# Patient Record
Sex: Male | Born: 1935 | Race: White | Hispanic: No | Smoking: Never smoker
Health system: Southern US, Community
[De-identification: ages and names within clinical notes are randomized; demographics above are authoritative.]

## PROBLEM LIST (undated history)

## (undated) DIAGNOSIS — C61 Malignant neoplasm of prostate: Secondary | ICD-10-CM

## (undated) DIAGNOSIS — F419 Anxiety disorder, unspecified: Secondary | ICD-10-CM

## (undated) DIAGNOSIS — Z45018 Encounter for adjustment and management of other part of cardiac pacemaker: Secondary | ICD-10-CM

## (undated) DIAGNOSIS — I200 Unstable angina: Secondary | ICD-10-CM

## (undated) DIAGNOSIS — R739 Hyperglycemia, unspecified: Secondary | ICD-10-CM

## (undated) DIAGNOSIS — E7800 Pure hypercholesterolemia, unspecified: Secondary | ICD-10-CM

## (undated) DIAGNOSIS — I442 Atrioventricular block, complete: Principal | ICD-10-CM

## (undated) DIAGNOSIS — N183 Chronic kidney disease, stage 3 (moderate): Secondary | ICD-10-CM

## (undated) DIAGNOSIS — I10 Essential (primary) hypertension: Secondary | ICD-10-CM

## (undated) DIAGNOSIS — I2510 Atherosclerotic heart disease of native coronary artery without angina pectoris: Secondary | ICD-10-CM

## (undated) HISTORY — DX: Atherosclerotic heart disease of native coronary artery without angina pectoris: I25.10

## (undated) HISTORY — DX: Encounter for adjustment and management of other part of cardiac pacemaker: Z45.018

## (undated) HISTORY — DX: Hyperglycemia, unspecified: R73.9

## (undated) HISTORY — DX: Unstable angina: I20.0

## (undated) HISTORY — DX: Pure hypercholesterolemia, unspecified: E78.00

## (undated) HISTORY — DX: Atrioventricular block, complete: I44.2

## (undated) HISTORY — DX: Essential (primary) hypertension: I10

## (undated) HISTORY — DX: Chronic kidney disease, stage 3 (moderate): N18.3

## (undated) HISTORY — DX: Malignant neoplasm of prostate: C61

---

## 1998-12-28 ENCOUNTER — Other Ambulatory Visit: Admission: RE | Admit: 1998-12-28 | Discharge: 1998-12-28 | Payer: Self-pay | Admitting: Urology

## 1999-01-11 ENCOUNTER — Encounter: Admission: RE | Admit: 1999-01-11 | Discharge: 1999-01-11 | Payer: Self-pay | Admitting: Urology

## 1999-01-11 ENCOUNTER — Encounter: Payer: Self-pay | Admitting: Urology

## 1999-02-21 ENCOUNTER — Encounter: Payer: Self-pay | Admitting: Urology

## 1999-02-22 ENCOUNTER — Inpatient Hospital Stay (HOSPITAL_COMMUNITY): Admission: RE | Admit: 1999-02-22 | Discharge: 1999-02-25 | Payer: Self-pay | Admitting: Urology

## 1999-03-14 ENCOUNTER — Encounter: Payer: Self-pay | Admitting: Urology

## 1999-03-14 ENCOUNTER — Ambulatory Visit (HOSPITAL_COMMUNITY): Admission: RE | Admit: 1999-03-14 | Discharge: 1999-03-14 | Payer: Self-pay | Admitting: Urology

## 2000-08-02 ENCOUNTER — Encounter: Payer: Self-pay | Admitting: Emergency Medicine

## 2000-08-02 ENCOUNTER — Emergency Department (HOSPITAL_COMMUNITY): Admission: EM | Admit: 2000-08-02 | Discharge: 2000-08-03 | Payer: Self-pay | Admitting: Emergency Medicine

## 2010-02-13 ENCOUNTER — Inpatient Hospital Stay (HOSPITAL_COMMUNITY)
Admission: AD | Admit: 2010-02-13 | Discharge: 2010-02-21 | Payer: Self-pay | Source: Home / Self Care | Attending: Thoracic Surgery (Cardiothoracic Vascular Surgery) | Admitting: Thoracic Surgery (Cardiothoracic Vascular Surgery)

## 2010-02-14 ENCOUNTER — Encounter: Payer: Self-pay | Admitting: Thoracic Surgery (Cardiothoracic Vascular Surgery)

## 2010-02-14 ENCOUNTER — Encounter (INDEPENDENT_AMBULATORY_CARE_PROVIDER_SITE_OTHER): Payer: Self-pay | Admitting: Cardiology

## 2010-02-19 DIAGNOSIS — I442 Atrioventricular block, complete: Secondary | ICD-10-CM

## 2010-02-19 HISTORY — DX: Atrioventricular block, complete: I44.2

## 2010-02-19 HISTORY — PX: PACEMAKER INSERTION: SHX728

## 2010-02-26 ENCOUNTER — Encounter: Payer: Self-pay | Admitting: Internal Medicine

## 2010-02-28 ENCOUNTER — Encounter: Payer: Self-pay | Admitting: Internal Medicine

## 2010-02-28 ENCOUNTER — Ambulatory Visit: Admission: RE | Admit: 2010-02-28 | Discharge: 2010-02-28 | Payer: Self-pay | Source: Home / Self Care

## 2010-03-11 ENCOUNTER — Ambulatory Visit
Admission: RE | Admit: 2010-03-11 | Discharge: 2010-03-11 | Payer: Self-pay | Source: Home / Self Care | Attending: Thoracic Surgery (Cardiothoracic Vascular Surgery) | Admitting: Thoracic Surgery (Cardiothoracic Vascular Surgery)

## 2010-03-11 ENCOUNTER — Encounter
Admission: RE | Admit: 2010-03-11 | Discharge: 2010-03-11 | Payer: Self-pay | Source: Home / Self Care | Attending: Thoracic Surgery (Cardiothoracic Vascular Surgery) | Admitting: Thoracic Surgery (Cardiothoracic Vascular Surgery)

## 2010-03-12 ENCOUNTER — Ambulatory Visit (HOSPITAL_COMMUNITY)
Admission: RE | Admit: 2010-03-12 | Discharge: 2010-03-12 | Payer: Self-pay | Source: Home / Self Care | Attending: Thoracic Surgery (Cardiothoracic Vascular Surgery) | Admitting: Thoracic Surgery (Cardiothoracic Vascular Surgery)

## 2010-03-19 LAB — BODY FLUID CULTURE
Culture: NO GROWTH
Gram Stain: NONE SEEN

## 2010-03-22 NOTE — Op Note (Signed)
  NAME:  QUANAH, MAJKA NO.:  [id number]  MEDICAL RECORD NO.:  [id number]          PATIENT TYPE:  INP  LOCATION:  2307                         FACILITY:  [facility]  PHYSICIAN:  Bedelia Person, M.D.        DATE OF BIRTH:  May 12, 1932  DATE OF PROCEDURE:  02/15/2010 DATE OF DISCHARGE:                              OPERATIVE REPORT   PROCEDURE:  Intraoperative transesophageal echocardiography.  Mr. Albaugh is an elderly male with severe dysrhythmia and coronary artery disease scheduled at this time for coronary artery bypass grafting.  The TEE will be used intraoperatively to assess left ventricular function during off bypass, coronary artery bypass grafting as well as valvular function.  The patient has no Pathology of the esophagus or gastric region.  The patient was induced with general anesthesia.  The airway was secured with an oral endotracheal tube.  Oral gastric contents were suctioned with an orogastric tube which was then removed and a 3-D transesophageal echo probe was inserted blindly down the oropharynx with no significant resistance.  At the completion of the procedure, the probe was removed. There was no evidence of oropharyngeal damage with no facial tattooing noted.  PREBYPASS EXAMINATION:  Left ventricle was moderately thickened concentrically.  There were no segmental defects.  Contractility was overall good.  Left atrium normal size and appearance.  Appendage was clean.  Inner atrial septum was intact.  Mitral valve 2 leaflets opened and closing appropriately, thin freely mobile coapting in the valvular plane.  No masses or vegetations noted.  Color Doppler revealed no mitral insufficiency.  The aortic valve 3 leaflets all opening and closing appropriately.  No calcification detected.  Color Doppler revealing no aortic insufficiency.  Right heart exam appeared normal. Swan-Ganz catheter could be noted across tricuspid valve.  Tricuspid valve  appearance was normal.  Color Doppler revealing just a trace insufficiency flow.  The patient had no ischemic episodes during off bypass coronary artery bypass grafting.  Contractility remained good throughout.  No segmental defects in left ventricular function were noted.  At the completion of the bypass, there was no changes to the prebypass examination.          ______________________________ Bedelia Person, M.D.     LK/MEDQ  D:  02/15/2010  T:  02/16/2010  Job:  [id number]  Electronically Signed by Bedelia Person M.D. on 03/22/2010 05:47:31 PM

## 2010-03-28 NOTE — Cardiovascular Report (Signed)
Summary: Office Visit   Office Visit   Imported By: Roderic Ovens 03/05/2010 14:04:40  _____________________________________________________________________  External Attachment:    Type:   Image     Comment:   External Document

## 2010-03-28 NOTE — Procedures (Signed)
Summary: [email or web address]   Current Medications (verified): 1)  Aspir-Low 81 Mg Tbec (Aspirin) .... One By Mouth Daily 2)  Lisinopril 5 Mg Tabs (Lisinopril) .... One By Mouth Daily 3)  Metoprolol Succinate 25 Mg Xr24h-Tab (Metoprolol Succinate) .... One By Mouth Two Times A Day 4)  Crestor 20 Mg Tabs (Rosuvastatin Calcium) .... One By Mouth Daily 5)  Ultram Er 100 Mg Xr24h-Tab (Tramadol Hcl) .... Every 4 Hours As Needed 6)  Cosopt 22.3-6.8 Mg/ml Soln (Dorzolamide Hcl-Timolol Mal) .Marland Kitchen.. 1 Drop Left Eye Two Times A Day 7)  Lumigan 0.03 % Soln (Bimatoprost) .... One Drop Left Eye At Bedtime 8)  Multivitamins  Caps (Multiple Vitamin) .... One By Mouth Daily 9)  Xanax 1 Mg Tabs (Alprazolam) .... 1/2 By Mouth Two Times A Day  Allergies (verified): No Known Drug Allergies  PPM Specifications Following MD:  Hillis Range, MD     PPM Vendor:  Medtronic     PPM Model Number:  ADDRL1     PPM Serial Number:  [id number] H PPM DOI:  02/19/2010     PPM Implanting MD:  Hillis Range, MD  Lead 1    Location: RA     DOI: 02/19/2010     Model #: [id number]     Serial #: [id number]     Status: active Lead 2    Location: RV     DOI: 02/19/2010     Model #: [id number]     Serial #: [id number]     Status: active  Magnet Response Rate:  BOL 85 ERI 65  Indications:  CHB   PPM Follow Up Remote Check?  No Battery Voltage:  2.80 V     Battery Est. Longevity:  9 years     Pacer Dependent:  Yes       PPM Device Measurements Atrium  Amplitude: 2.8 mV, Impedance: 471 ohms, Threshold: 0.5 V at 0.4 msec Right Ventricle  Amplitude: 5.6 mV, Impedance: 465 ohms, Threshold: 0.75 V at 0.4 msec  Episodes MS Episodes:  3     Percent Mode Switch:  <0.1%     Coumadin:  No Ventricular High Rate:  0     Atrial Pacing:  2.8%     Ventricular Pacing:  99.2%  Parameters Mode:  DDD     Lower Rate Limit:  60     Upper Rate Limit:  120 Paced AV Delay:  180     Sensed AV Delay:  150 Next Cardiology Appt Due:  05/24/2010 Tech Comments:  Steri strips  removed, no redness but a slight amount of edema noted.  No parameter changes.  Device function normal.  ROV 3/30 with Dr. Johney Frame. Altha Harm, LPN  February 28, 2010 5:32 PM    Patient Instructions: 1)  You may shower.  No lotions or oils to pacemaker site. 2)  No lifting > 10lbs with the left arm for 2 weeks. 3)  You have a return visit with Dr. Johney Frame scheduled for 05/24/10 @ 10:20am.  Please bring a list of your medications to that visit.

## 2010-03-28 NOTE — Miscellaneous (Signed)
Summary: Device preload  Clinical Lists Changes  Observations: Added new observation of PPM INDICATN: CHB (02/26/2010 13:57) Added new observation of MAGNET RTE: BOL 85 ERI 65 (02/26/2010 13:57) Added new observation of PPMLEADSTAT2: active (02/26/2010 13:57) Added new observation of PPMLEADSER2: [id number] (02/26/2010 13:57) Added new observation of PPMLEADMOD2: 5076  (02/26/2010 13:57) Added new observation of PPMLEADLOC2: RV  (02/26/2010 13:57) Added new observation of PPMLEADSTAT1: active  (02/26/2010 13:57) Added new observation of PPMLEADSER1: [id number]  (02/26/2010 13:57) Added new observation of PPMLEADMOD1: 5076  (02/26/2010 13:57) Added new observation of PPMLEADLOC1: RA  (02/26/2010 13:57) Added new observation of PPM IMP MD: Hillis Range, MD  (02/26/2010 13:57) Added new observation of PPMLEADDOI2: 02/19/2010  (02/26/2010 13:57) Added new observation of PPMLEADDOI1: 02/19/2010  (02/26/2010 13:57) Added new observation of PPM DOI: 02/19/2010  (02/26/2010 13:57) Added new observation of PPM SERL#: [id number]  (02/26/2010 13:57) Added new observation of PPM MODL#: [id number]  (02/26/2010 44:01) Added new observation of PACEMAKERMFG: Medtronic  (02/26/2010 13:57) Added new observation of PACEMAKER MD: Hillis Range, MD  (02/26/2010 13:57)      PPM Specifications Following MD:  Hillis Range, MD     PPM Vendor:  [employer]onic     PPM Model Number:  ADDRL1     PPM Serial Number:  [id number] H PPM DOI:  02/19/2010     PPM Implanting MD:  Hillis Range, MD  Lead 1    Location: RA     DOI: 02/19/2010     Model #: [id number]     Serial #: [id number]     Status: active Lead 2    Location: RV     DOI: 02/19/2010     Model #: [id number]     Serial #: [id number]     Status: active  Magnet Response Rate:  BOL 85 ERI 65  Indications:  CHB

## 2010-04-04 NOTE — Consult Note (Signed)
NAME:  James Harvey, HICKAM NO.:  [id number]  MEDICAL RECORD NO.:  [id number]          PATIENT TYPE:  INP  LOCATION:  2923                         FACILITY:  [facility]  PHYSICIAN:  Doylene Canning. Ladona Ridgel, MD    DATE OF BIRTH:  09/08/1932  DATE OF CONSULTATION:  02/13/2010 DATE OF DISCHARGE:                                CONSULTATION   REQUESTING PHYSICIAN:  Vonna Kotyk R. Jacinto Halim, MD  INDICATION FOR CONSULTATION:  Evaluation of symptomatic high-grade heart block.  HISTORY OF PRESENT ILLNESS:  The patient is a very pleasant 77 year old man whose health has been quite good.  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  He notes he tries to take good care of his health. Over the last several weeks, he has noted increasing tightness in his chest and neck and jaw discomfort.  No pain is involved.  He does have some shortness of breath, and this all occurs with exertion or eating a big meal.  He has not had syncope.  He initially presented to Dr. Fredirick Maudlin office and was referred to Dr. Verl Dicker, and he was found to have high-grade heart block.  The patient is admitted for additional evaluation.  The patient does not have a family history of heart disease and he has otherwise lead a healthy lifestyle.  PAST MEDICAL HISTORY:  Notable for prostate cancer which he had undergone extensive surgical therapy.  He has problems with continence. He has minimal hypertension.  He denies any other surgeries.  SOCIAL HISTORY:  The patient is married and divorced.  He denies tobacco or ethanol abuse.  He runs a Technical brewer.  FAMILY HISTORY:  Notable that he is 1 of 13 children.  He has 2 siblings with pacemakers.  His father died of complications of hypertension in his 2s, mother died of complications of "being too old."  REVIEW OF SYSTEMS:  All systems were reviewed and negative except as noted in the HPI.  PHYSICAL EXAMINATION:  GENERAL:  He is a pleasant, well-appearing 77-year-old man year-old man  who looks younger than his stated age. VITAL SIGNS:  Blood pressure was 127/70, the pulse was 40 and irregular, respirations were 18, temperature is 98. HEENT:  Normocephalic, atraumatic.  Pupils equal and round.  Oropharynx is moist.  Sclerae anicteric. NECK:  No jugular venous distention and no thyromegaly.  Trachea was midline.  The carotids were 2+ and symmetric. LUNGS:  Clear bilaterally to auscultation.  No wheezes, rales, or rhonchi are present.  No increased work of breathing. CARDIAC:  Irregular bradycardia with normal S1 and S2.  PMI did not appear enlarged, nor laterally displaced. ABDOMEN:  Soft, nontender.  There is no organomegaly. EXTREMITIES:  Demonstrate no cyanosis, clubbing, or edema.  Pulses were 2+ and symmetric. NEUROLOGIC:  Alert and oriented x3 with cranial nerves intact.  Strength is 5/5 and symmetric.  His EKG demonstrates sinus rhythm with high-grade heart block which varied between 2:1 heart block changing to Wenckebach and at times complete heart block.  Labs were pending at the time of dictation. IMPRESSION: 1. High-grade heart block, unclear etiology. 2. Chest pressure, neck and jaw discomfort,  shortness of breath, all     worrisome for a crescendo angina in a patient with only minimal     cardiac risk factors.  DISCUSSION:  I have discussed treatment options with the patient.  He is scheduled to undergone catheterization by Dr. Jacinto Halim and I think this is very appropriate with his cardiac risk factors and his fairly typical symptoms for a coronary disease.  If he has no evidence of any inferior ischemia or ischemia of the AV node from coronary disease, then permanent pacemaker insertion would be recommended.  We will plan on waiting his heart catheterization and make additional recommendations specifically whether or not pacemaker is indicated based on the results of his catheterization.     Doylene Canning. Ladona Ridgel, MD     GWT/MEDQ  D:  02/13/2010   T:  02/14/2010  Job:  [id number]  cc:   Cristy Hilts. Jacinto Halim, MD  Electronically Signed by Lewayne Bunting MD on 04/04/2010 05:04:15 PM

## 2010-04-05 ENCOUNTER — Ambulatory Visit
Admission: RE | Admit: 2010-04-05 | Discharge: 2010-04-05 | Disposition: A | Discharge status: Home / Self Care | Payer: BLUE CROSS/BLUE SHIELD | Source: Ambulatory Visit | Attending: Thoracic Surgery (Cardiothoracic Vascular Surgery) | Admitting: Thoracic Surgery (Cardiothoracic Vascular Surgery)

## 2010-04-05 ENCOUNTER — Other Ambulatory Visit: Payer: Self-pay | Admitting: Thoracic Surgery (Cardiothoracic Vascular Surgery)

## 2010-04-05 ENCOUNTER — Encounter: Payer: Self-pay | Admitting: Internal Medicine

## 2010-04-05 ENCOUNTER — Encounter (INDEPENDENT_AMBULATORY_CARE_PROVIDER_SITE_OTHER): Payer: Self-pay | Admitting: Thoracic Surgery (Cardiothoracic Vascular Surgery)

## 2010-04-05 DIAGNOSIS — I2510 Atherosclerotic heart disease of native coronary artery without angina pectoris: Secondary | ICD-10-CM

## 2010-04-05 DIAGNOSIS — J90 Pleural effusion, not elsewhere classified: Secondary | ICD-10-CM

## 2010-04-05 NOTE — Assessment & Plan Note (Addendum)
OFFICE VISIT  James Harvey, James Harvey DOB:  26-Apr-1932                                        April 05, 2010 CHART #:  [id number]  HISTORY OF PRESENT ILLNESS:  The patient returns for further followup of off-pump coronary artery bypass grafting x2 on February 15, 2010.  He was last seen here in the office on March 11, 2100.  At that time, he was found to have new moderate-sized left pleural effusion.  He underwent left needle thoracentesis without complication.  A total of 550 mL of fluid was evacuated.  Since then the patient has done quite well.  He reports that his breathing was improved after thoracentesis. He otherwise has had no problems at all and he is eager to increase his physical activity.  He continues to be followed closely by Dr. Jacinto Halim. Medications remain unchanged.  Physical exam is notable for well-appearing male with blood pressure 113/61, pulse 78, oxygen saturation 98% on room air.  Examination of the chest reveals a median sternotomy scar that is healed nicely.  The sternum is stable.  Auscultation reveals clear breath sounds that are symmetrical bilaterally.  Cardiovascular exam is notable for regular rate and rhythm.  The abdomen is soft and nontender.  The extremities are warm and well-perfused.  DIAGNOSTIC TEST:  Chest x-ray performed today at the [facility] is reviewed.  This demonstrates clear lung fields with trivial residual pleural effusion.  There has been no significant reaccumulation at all.  All the sternal wires appear intact.  The pacemaker leads appear intact.  IMPRESSION:  Excellent progress following recent coronary artery bypass grafting.  The patient's pleural effusion has resolved and not accumulated at all.  PLAN:  I have encouraged the patient to continue to increase his physical activity as tolerated with his only limitation at this point, remaining that he refrain from heavy lifting or  strenuous use of his arms or shoulders for at least another 6 weeks or so.  All of his questions have been addressed.  In the future he will call or return to see [place] as needed.  Salvatore Decent. Cornelius Moras, M.D. Electronically Signed  CHO/MEDQ  D:  04/05/2010  T:  04/05/2010  Job:  [id number]  cc:   Cristy Hilts. Jacinto Halim, MD Denita Lung, MD

## 2010-04-08 ENCOUNTER — Encounter: Payer: Self-pay | Admitting: Thoracic Surgery (Cardiothoracic Vascular Surgery)

## 2010-04-23 NOTE — Progress Notes (Signed)
Summary: [facility]: Office Visit  [facility]: Office Visit   Imported By: Earl Many 04/16/2010 18:20:11  _____________________________________________________________________  External Attachment:    Type:   Image     Comment:   External Document

## 2010-05-06 LAB — CBC
HCT: 27.6 % — ABNORMAL LOW (ref 39.0–52.0)
HCT: 28 % — ABNORMAL LOW (ref 39.0–52.0)
HCT: 31.5 % — ABNORMAL LOW (ref 39.0–52.0)
HCT: 34.9 % — ABNORMAL LOW (ref 39.0–52.0)
Hemoglobin: 10.4 g/dL — ABNORMAL LOW (ref 13.0–17.0)
Hemoglobin: 9.2 g/dL — ABNORMAL LOW (ref 13.0–17.0)
Hemoglobin: 9.4 g/dL — ABNORMAL LOW (ref 13.0–17.0)
MCH: 31.3 pg (ref 26.0–34.0)
MCH: 31.3 pg (ref 26.0–34.0)
MCH: 31.5 pg (ref 26.0–34.0)
MCH: 31.6 pg (ref 26.0–34.0)
MCH: 31.9 pg (ref 26.0–34.0)
MCHC: 33 g/dL (ref 30.0–36.0)
MCHC: 33 g/dL (ref 30.0–36.0)
MCHC: 33.2 g/dL (ref 30.0–36.0)
MCHC: 33.3 g/dL (ref 30.0–36.0)
MCHC: 33.6 g/dL (ref 30.0–36.0)
MCHC: 33.9 g/dL (ref 30.0–36.0)
MCV: 94.8 fL (ref 78.0–100.0)
MCV: 94.8 fL (ref 78.0–100.0)
MCV: 94.8 fL (ref 78.0–100.0)
MCV: 95.6 fL (ref 78.0–100.0)
Platelets: 106 10*3/uL — ABNORMAL LOW (ref 150–400)
Platelets: 160 10*3/uL (ref 150–400)
Platelets: 166 10*3/uL (ref 150–400)
Platelets: 181 10*3/uL (ref 150–400)
Platelets: 183 10*3/uL (ref 150–400)
Platelets: 227 10*3/uL (ref 150–400)
RBC: 2.91 MIL/uL — ABNORMAL LOW (ref 4.22–5.81)
RBC: 3.1 MIL/uL — ABNORMAL LOW (ref 4.22–5.81)
RBC: 3.18 MIL/uL — ABNORMAL LOW (ref 4.22–5.81)
RBC: 3.3 MIL/uL — ABNORMAL LOW (ref 4.22–5.81)
RBC: 3.31 MIL/uL — ABNORMAL LOW (ref 4.22–5.81)
RBC: 3.35 MIL/uL — ABNORMAL LOW (ref 4.22–5.81)
RBC: 3.67 MIL/uL — ABNORMAL LOW (ref 4.22–5.81)
RBC: 4.07 MIL/uL — ABNORMAL LOW (ref 4.22–5.81)
RDW: 13.7 % (ref 11.5–15.5)
RDW: 13.9 % (ref 11.5–15.5)
RDW: 14 % (ref 11.5–15.5)
RDW: 14.1 % (ref 11.5–15.5)
WBC: 12.5 10*3/uL — ABNORMAL HIGH (ref 4.0–10.5)
WBC: 14.4 10*3/uL — ABNORMAL HIGH (ref 4.0–10.5)
WBC: 7.2 10*3/uL (ref 4.0–10.5)
WBC: 8 10*3/uL (ref 4.0–10.5)
WBC: 8.1 10*3/uL (ref 4.0–10.5)

## 2010-05-06 LAB — GLUCOSE, CAPILLARY
Glucose-Capillary: 110 mg/dL — ABNORMAL HIGH (ref 70–99)
Glucose-Capillary: 114 mg/dL — ABNORMAL HIGH (ref 70–99)
Glucose-Capillary: 115 mg/dL — ABNORMAL HIGH (ref 70–99)
Glucose-Capillary: 121 mg/dL — ABNORMAL HIGH (ref 70–99)
Glucose-Capillary: 128 mg/dL — ABNORMAL HIGH (ref 70–99)
Glucose-Capillary: 131 mg/dL — ABNORMAL HIGH (ref 70–99)
Glucose-Capillary: 132 mg/dL — ABNORMAL HIGH (ref 70–99)
Glucose-Capillary: 136 mg/dL — ABNORMAL HIGH (ref 70–99)
Glucose-Capillary: 137 mg/dL — ABNORMAL HIGH (ref 70–99)
Glucose-Capillary: 154 mg/dL — ABNORMAL HIGH (ref 70–99)
Glucose-Capillary: 187 mg/dL — ABNORMAL HIGH (ref 70–99)
Glucose-Capillary: 201 mg/dL — ABNORMAL HIGH (ref 70–99)
Glucose-Capillary: 230 mg/dL — ABNORMAL HIGH (ref 70–99)
Glucose-Capillary: 99 mg/dL (ref 70–99)

## 2010-05-06 LAB — BASIC METABOLIC PANEL
BUN: 10 mg/dL (ref 6–23)
BUN: 10 mg/dL (ref 6–23)
BUN: 12 mg/dL (ref 6–23)
CO2: 23 mEq/L (ref 19–32)
CO2: 24 mEq/L (ref 19–32)
CO2: 24 mEq/L (ref 19–32)
CO2: 25 mEq/L (ref 19–32)
CO2: 26 mEq/L (ref 19–32)
Calcium: 7.9 mg/dL — ABNORMAL LOW (ref 8.4–10.5)
Calcium: 8.2 mg/dL — ABNORMAL LOW (ref 8.4–10.5)
Calcium: 8.3 mg/dL — ABNORMAL LOW (ref 8.4–10.5)
Chloride: 106 mEq/L (ref 96–112)
Chloride: 108 mEq/L (ref 96–112)
Chloride: 108 mEq/L (ref 96–112)
Chloride: 110 mEq/L (ref 96–112)
Creatinine, Ser: 0.81 mg/dL (ref 0.4–1.5)
Creatinine, Ser: 0.88 mg/dL (ref 0.4–1.5)
Creatinine, Ser: 0.91 mg/dL (ref 0.4–1.5)
Creatinine, Ser: 0.98 mg/dL (ref 0.4–1.5)
Creatinine, Ser: 1.08 mg/dL (ref 0.4–1.5)
GFR calc Af Amer: >60 mL/min (ref >60)
GFR calc Af Amer: >60 mL/min (ref >60)
GFR calc Af Amer: >60 mL/min (ref >60)
GFR calc Af Amer: >60 mL/min (ref >60)
GFR calc Af Amer: >60 mL/min (ref >60)
GFR calc Af Amer: >60 mL/min (ref >60)
GFR calc non Af Amer: 52 mL/min — ABNORMAL LOW (ref >60)
GFR calc non Af Amer: >60 mL/min (ref >60)
GFR calc non Af Amer: >60 mL/min (ref >60)
GFR calc non Af Amer: >60 mL/min (ref >60)
Glucose, Bld: 108 mg/dL — ABNORMAL HIGH (ref 70–99)
Glucose, Bld: 112 mg/dL — ABNORMAL HIGH (ref 70–99)
Glucose, Bld: 117 mg/dL — ABNORMAL HIGH (ref 70–99)
Glucose, Bld: 118 mg/dL — ABNORMAL HIGH (ref 70–99)
Glucose, Bld: 89 mg/dL (ref 70–99)
Potassium: 3.7 mEq/L (ref 3.5–5.1)
Potassium: 3.8 mEq/L (ref 3.5–5.1)
Potassium: 4 mEq/L (ref 3.5–5.1)
Potassium: 4 mEq/L (ref 3.5–5.1)
Potassium: 4.1 mEq/L (ref 3.5–5.1)
Sodium: 138 mEq/L (ref 135–145)
Sodium: 139 mEq/L (ref 135–145)
Sodium: 140 mEq/L (ref 135–145)
Sodium: 142 mEq/L (ref 135–145)

## 2010-05-06 LAB — POCT I-STAT 3, ART BLOOD GAS (G3+)
Acid-base deficit: 1 mmol/L (ref 0.0–2.0)
Acid-base deficit: 2 mmol/L (ref 0.0–2.0)
Acid-base deficit: 4 mmol/L — ABNORMAL HIGH (ref 0.0–2.0)
Acid-base deficit: 4 mmol/L — ABNORMAL HIGH (ref 0.0–2.0)
Bicarbonate: 20.3 mEq/L (ref 20.0–24.0)
Bicarbonate: 21.3 mEq/L (ref 20.0–24.0)
Bicarbonate: 23 mEq/L (ref 20.0–24.0)
O2 Saturation: 90 %
O2 Saturation: 92 %
O2 Saturation: 99 %
Patient temperature: 36.7
Patient temperature: 37.1
Patient temperature: 37.3
Patient temperature: 37.5
TCO2: 23 mmol/L (ref 0–100)
TCO2: 24 mmol/L (ref 0–100)
pCO2 arterial: 31.6 mmHg — ABNORMAL LOW (ref 35.0–45.0)
pCO2 arterial: 35.3 mmHg (ref 35.0–45.0)
pH, Arterial: 7.373 (ref 7.350–7.450)
pH, Arterial: 7.378 (ref 7.350–7.450)
pH, Arterial: 7.407 (ref 7.350–7.450)
pH, Arterial: 7.428 (ref 7.350–7.450)
pO2, Arterial: 162 mmHg — ABNORMAL HIGH (ref 80.0–100.0)
pO2, Arterial: 55 mmHg — ABNORMAL LOW (ref 80.0–100.0)
pO2, Arterial: 60 mmHg — ABNORMAL LOW (ref 80.0–100.0)
pO2, Arterial: 61 mmHg — ABNORMAL LOW (ref 80.0–100.0)

## 2010-05-06 LAB — BLOOD GAS, ARTERIAL
Bicarbonate: 22.3 mEq/L (ref 20.0–24.0)
pH, Arterial: 7.398 (ref 7.350–7.450)
pO2, Arterial: 72.7 mmHg — ABNORMAL LOW (ref 80.0–100.0)

## 2010-05-06 LAB — LIPID PANEL
Cholesterol: 108 mg/dL (ref 0–200)
Cholesterol: 137 mg/dL (ref 0–200)
HDL: 51 mg/dL (ref >39)
LDL Cholesterol: 47 mg/dL (ref 0–99)
LDL Cholesterol: 60 mg/dL (ref 0–99)
Total CHOL/HDL Ratio: 2.4 RATIO
Triglycerides: 131 mg/dL (ref <150)
VLDL: 26 mg/dL (ref 0–40)

## 2010-05-06 LAB — CARDIAC PANEL(CRET KIN+CKTOT+MB+TROPI)
CK, MB: 2.6 ng/mL (ref 0.3–4.0)
Total CK: 102 U/L (ref 7–232)
Total CK: 130 U/L (ref 7–232)
Total CK: 94 U/L (ref 7–232)
Troponin I: 0.14 ng/mL — ABNORMAL HIGH (ref 0.00–0.06)

## 2010-05-06 LAB — MAGNESIUM
Magnesium: 2.4 mg/dL (ref 1.5–2.5)
Magnesium: 3 mg/dL — ABNORMAL HIGH (ref 1.5–2.5)

## 2010-05-06 LAB — CYP450

## 2010-05-06 LAB — HEMOGLOBIN A1C
Mean Plasma Glucose: 128 mg/dL — ABNORMAL HIGH (ref <117)
Mean Plasma Glucose: 134 mg/dL — ABNORMAL HIGH (ref <117)

## 2010-05-06 LAB — POCT I-STAT 4, (NA,K, GLUC, HGB,HCT)
Glucose, Bld: 102 mg/dL — ABNORMAL HIGH (ref 70–99)
Glucose, Bld: 106 mg/dL — ABNORMAL HIGH (ref 70–99)
HCT: 31 % — ABNORMAL LOW (ref 39.0–52.0)
HCT: 32 % — ABNORMAL LOW (ref 39.0–52.0)
HCT: 33 % — ABNORMAL LOW (ref 39.0–52.0)
Hemoglobin: 10.9 g/dL — ABNORMAL LOW (ref 13.0–17.0)
Hemoglobin: 10.9 g/dL — ABNORMAL LOW (ref 13.0–17.0)
Hemoglobin: 11.2 g/dL — ABNORMAL LOW (ref 13.0–17.0)
Potassium: 4 mEq/L (ref 3.5–5.1)
Sodium: 142 mEq/L (ref 135–145)

## 2010-05-06 LAB — URINALYSIS, ROUTINE W REFLEX MICROSCOPIC
Ketones, ur: NEGATIVE mg/dL
Nitrite: NEGATIVE
Specific Gravity, Urine: 1.016 (ref 1.005–1.030)
pH: 7 (ref 5.0–8.0)

## 2010-05-06 LAB — COMPREHENSIVE METABOLIC PANEL
AST: 35 U/L (ref 0–37)
Alkaline Phosphatase: 56 U/L (ref 39–117)
BUN: 20 mg/dL (ref 6–23)
Chloride: 109 mEq/L (ref 96–112)
GFR calc Af Amer: >60 mL/min (ref >60)
GFR calc non Af Amer: >60 mL/min (ref >60)
Glucose, Bld: 101 mg/dL — ABNORMAL HIGH (ref 70–99)
Potassium: 5 mEq/L (ref 3.5–5.1)
Sodium: 139 mEq/L (ref 135–145)

## 2010-05-06 LAB — PREPARE PLATELETS: Unit division: 0

## 2010-05-06 LAB — CREATININE, SERUM
Creatinine, Ser: 0.8 mg/dL (ref 0.4–1.5)
Creatinine, Ser: 1.08 mg/dL (ref 0.4–1.5)
GFR calc Af Amer: >60 mL/min (ref >60)
GFR calc non Af Amer: >60 mL/min (ref >60)

## 2010-05-06 LAB — POCT I-STAT, CHEM 8
Calcium, Ion: 1.15 mmol/L (ref 1.12–1.32)
Glucose, Bld: 134 mg/dL — ABNORMAL HIGH (ref 70–99)
HCT: 31 % — ABNORMAL LOW (ref 39.0–52.0)
HCT: 33 % — ABNORMAL LOW (ref 39.0–52.0)
Hemoglobin: 10.5 g/dL — ABNORMAL LOW (ref 13.0–17.0)
Hemoglobin: 11.2 g/dL — ABNORMAL LOW (ref 13.0–17.0)
Sodium: 136 mEq/L (ref 135–145)
TCO2: 20 mmol/L (ref 0–100)
TCO2: 24 mmol/L (ref 0–100)

## 2010-05-06 LAB — APTT: aPTT: 32 seconds (ref 24–37)

## 2010-05-06 LAB — TYPE AND SCREEN

## 2010-05-06 LAB — PROTIME-INR
INR: 1.05 (ref 0.00–1.49)
Prothrombin Time: 13.9 seconds (ref 11.6–15.2)
Prothrombin Time: 15.5 seconds — ABNORMAL HIGH (ref 11.6–15.2)

## 2010-05-06 LAB — URINE MICROSCOPIC-ADD ON

## 2010-05-06 LAB — POCT I-STAT GLUCOSE
Glucose, Bld: 97 mg/dL (ref 70–99)
Operator id: 305871

## 2010-05-06 LAB — PLATELET INHIBITION P2Y12: Platelet Function Baseline: 206 [PRU] (ref 194–418)

## 2010-05-11 ENCOUNTER — Encounter: Payer: Self-pay | Admitting: Internal Medicine

## 2010-05-24 ENCOUNTER — Ambulatory Visit (INDEPENDENT_AMBULATORY_CARE_PROVIDER_SITE_OTHER): Payer: Medicare Other | Admitting: Internal Medicine

## 2010-05-24 ENCOUNTER — Encounter: Payer: Self-pay | Admitting: Internal Medicine

## 2010-05-24 DIAGNOSIS — I10 Essential (primary) hypertension: Secondary | ICD-10-CM | POA: Insufficient documentation

## 2010-05-24 DIAGNOSIS — I2510 Atherosclerotic heart disease of native coronary artery without angina pectoris: Secondary | ICD-10-CM | POA: Insufficient documentation

## 2010-05-24 DIAGNOSIS — I442 Atrioventricular block, complete: Secondary | ICD-10-CM | POA: Insufficient documentation

## 2010-05-24 NOTE — Assessment & Plan Note (Signed)
Doing well s/p CABG No changes

## 2010-05-24 NOTE — Patient Instructions (Signed)
Your physician recommends that you schedule a follow-up appointment as needed with Dr Allred   

## 2010-05-24 NOTE — Assessment & Plan Note (Signed)
Above goal Salt restriction 

## 2010-05-24 NOTE — Progress Notes (Signed)
The patient presents today for routine electrophysiology followup.  He reports doing very well since his CABG and pacemaker implantation.  He has returned to moderate activity.  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  Today, he denies symptoms of palpitations, chest pain, shortness of breath, orthopnea, PND, lower extremity edema, dizziness, presyncope, syncope, or neurologic sequela.  The patient feels that he is tolerating medications without difficulties and is otherwise without complaint today.   Past Medical History  Diagnosis Date  . CAD (coronary artery disease)     s/p CABG 02/15/2010 (single vessel)  . Atrioventricular block, complete 02/19/10    s/p PPM by JA  . Hypertension   . Prostate cancer    Past Surgical History  Procedure Date  . Pacemaker insertion 02/19/10    by Fawn Kirk for complete heart block    Current outpatient prescriptions:ALPRAZolam (XANAX) 1 MG tablet, Take 1 mg by mouth at bedtime as needed.  , Disp: , Rfl: ;  aspirin 81 MG tablet, Take 81 mg by mouth daily.  , Disp: , Rfl: ;  bimatoprost (LUMIGAN) 0.03 % ophthalmic drops, Place 1 drop into the left eye at bedtime.  , Disp: , Rfl: ;  dorzolamide-timolol (COSOPT) 22.3-6.8 MG/ML ophthalmic solution, Place 1 drop into the left eye 2 (two) times daily.  , Disp: , Rfl:  lisinopril (PRINIVIL,ZESTRIL) 5 MG tablet, Take 5 mg by mouth daily.  , Disp: , Rfl: ;  metoprolol succinate (TOPROL-XL) 25 MG 24 hr tablet, Take 25 mg by mouth 2 (two) times daily.  , Disp: , Rfl: ;  Multiple Vitamin (MULTIVITAMIN) tablet, Take 1 tablet by mouth daily.  , Disp: , Rfl: ;  rosuvastatin (CRESTOR) 20 MG tablet, Take 20 mg by mouth daily.  , Disp: , Rfl:  traMADol (ULTRAM-ER) 100 MG 24 hr tablet, Take 100 mg by mouth every 4 (four) hours as needed.  , Disp: , Rfl:   No Known Allergies  History   Social History  . Marital Status: Divorced    Spouse Name: N/A    Number of Children: N/A  . Years of Education: N/A   Occupational History    . Not on file.   Social History Main Topics  . Smoking status: Never Smoker   . Smokeless tobacco: Not on file  . Alcohol Use: No  . Drug Use: No  . Sexually Active: Not on file   Other Topics Concern  . Not on file   Social History Narrative  . No narrative on file    Family History  Problem Relation Age of Onset  . Hypertension      ROS-  All systems are reviewed and are negative except as outlined in the HPI above   Physical Exam: Filed Vitals:   05/24/10 0949  BP: 160/90  Pulse: 66  Height: 5\' 11"  (1.803 m)  Weight: 198 lb 12.8 oz (90.175 kg)    GEN- The patient is well appearing, alert and oriented x 3 today.   Head- normocephalic, atraumatic Eyes-  Sclera clear, conjunctiva pink Ears- hearing intact Oropharynx- clear Neck- supple, no JVP Lymph- no cervical lymphadenopathy Lungs- Clear to ausculation bilaterally, normal work of breathing Chest- pacemaker pocket is well healed Heart- Regular rate and rhythm, no murmurs, rubs or gallops, PMI not laterally displaced GI- soft, NT, ND, + BS Extremities- no clubbing, cyanosis, or edema MS- no significant deformity or atrophy Skin- no rash or lesion Psych- euthymic mood, full affect Neuro- strength and sensation are  intact

## 2010-05-24 NOTE — Assessment & Plan Note (Signed)
Normal pacemaker function See Arita Miss Art report No changes today The patient will have his pacemaker checks regularly by Dr Jacinto Halim.  I will see him prn.

## 2010-07-09 NOTE — Assessment & Plan Note (Signed)
OFFICE VISIT   James Harvey, James Harvey  DOB:  Jun 27, 1932                                        March 11, 2010  CHART #:  [id number]   HISTORY OF PRESENT ILLNESS:  The patient returned for routine followup,  status post off-pump coronary artery bypass grafting x2 on February 15, 2010.  The patient presented with complete heart block prior to surgery.  Postoperatively, he underwent placement of a permanent pacemaker by Dr.  Johney Frame on December 27.  Since hospital discharge, the patient has  continued to do well.  He has not yet been seen in followup by Dr. Jacinto Halim  nor Dr. Johney Frame.  He reports mild residual soreness in his chest.  He  occasionally takes pain medications, but for the most part is not  requiring much.  He denies any shortness of breath.  He is not sleeping  well and he feels like he is still not quite comfortable in his own  home.  His appetite is fair.  His activity level has continued to  improve.  The remainder of his review of systems is unremarkable.  Medications remain unchanged from the time of hospital discharge.   PHYSICAL EXAMINATION:  General:  Notable for well-appearing male with  blood pressure 116/70, pulse 72, oxygen saturation 96% on room air.  Chest:  Examination of the chest reveals a median sternotomy incision  that is healing nicely.  The pacemaker incision is also healing well and  there is no fluctuance overlying the pacemaker pocket.  Auscultation of  the chest reveals clear breath sounds with diminished breath sounds at  the left lung base.  No wheezes, rales or rhonchi are noted.  Cardiovascular:  Regular rate and rhythm.  No murmurs, rubs or gallops  are appreciated.  Abdomen:  Soft, nontender.  Extremities:  Warm and  well-perfused.  The small incision from endoscopic vein harvest are  healing nicely.  There is no lower extremity edema.  The remainder of  his physical exam is unremarkable.   DIAGNOSTIC TEST:  Chest x-ray  performed today at the [facility] is reviewed.  This demonstrates moderate-to-large size left  pleural effusion with associated left lower lobe atelectasis.  This has  increased in comparison with the last x-ray prior to hospital discharge.  All the sternal wires appear intact and no other abnormalities were  noted.   IMPRESSION:  The patient is doing well, although he has developed a left  pleural effusion that has increased in size since hospital discharge.   PLAN:  We will send the patient for ultrasound-guided left needle  thoracentesis.  I have encouraged him to increase his physical activity  as tolerated with his only limitations at this point remaining that he  refrain from any sort of heavy lifting or strenuous use of his arms or  shoulders for at least another 2-3 months.  We have not changed any of  his current medications.  We will plan to see him back for further  followup in 4 weeks with a repeat chest x-ray.   Salvatore Decent. Cornelius Moras, M.D.  Electronically Signed   CHO/MEDQ  D:  03/11/2010  T:  03/11/2010  Job:  [id number]   cc:   Cristy Hilts. Jacinto Halim, MD  Denita Lung, MD

## 2010-07-12 NOTE — Op Note (Signed)
[place]. [facility]  Patient:    James Harvey Visit Number: [id number] MRN: [id number]          Service Type: SUR Location: [id number] Attending Physician:  Trisha Mangle Proc. Date: 02/22/99 Admit Date:  02/22/1999                             Operative Report  PREOPERATIVE DIAGNOSIS:  Adenocarcinoma of the prostate.  POSTOPERATIVE DIAGNOSIS:  Adenocarcinoma of the prostate.  PROCEDURE:  Radical retropubic prostatectomy.  SURGEON:  Mark C. Vernie Ammons, M.D.  ASSISTANT:  Excell Seltzer. Annabell Howells, M.D.  ANESTHESIA:  General endotracheal.  DRAINS:  A 20 French Foley catheter in the bladder and a #10 flat Blake drain in the pelvis.  SPECIMENS:  Bilateral pelvic lymph nodes (frozen section negative for metastatic disease) and prostate.  ESTIMATED BLOOD LOSS:  Approximately 400 cc.  TRANSFUSIONS:  None.  COMPLICATIONS:  None.  INDICATIONS:  The patient is a 77 year old male patient who previously has undergone radiation therapy for biopsy-proven adenocarcinoma of the prostate in the beginning of 1997.  His PSA fell only to 2.8 and then began to rise.  It had risen to 11.6 and a repeat biopsy revealed the presence of adenocarcinoma in both lobes of the prostate.  The risks, complications, and alternatives to surgery have been fully discussed with the patient and he understands and wishes to proceed with surgery.  This has been fully outlined in my office notes which have been placed on the chart.  DESCRIPTION OF PROCEDURE:  After informed consent, the patient was brought to the major OR, placed on the table, and administered general anesthesia.  In the supine position, his genitalia and lower abdomen were sterilely prepped and draped.  A  20 French Foley catheter was inserted in the bladder.  A midline incision was made and carried down to the rectus fascia which was divided in the midline and the rectus muscle bellies were parted.  The  retropubic space was then developed inferiorly and laterally.  The Bookwalter retractor was then inserted and pelvic lymph node dissection was undertaken.  It was first performed on the right side  where the lymph node impaction was dissected off of the medial and inferior aspects of the external iliac vein from the obturator fossa back to the bifurcation of he common iliac vein and swept off of the lateral pelvic sidewall and obturator nerve, which was maintained intact.  Small vessels and lymphatics were clipped with hemoclips and the specimen was removed and sent to pathology for frozen section  evaluation.  The contralateral lymph node impaction was treated in an identical  fashion.  After the results of the frozen section returned no evidence of metastatic disease in the lymph nodes, I proceeded with the remainder of the surgery.  I first pierced the endopelvic fascia approximately a centimeter lateral to the  prostate on both sides and developed this both anteriorly and superiorly next to the prostate.  I then placed a chromic suture around the superficial dorsal vein and divided that and divided the puboprostatic ligaments flush with the symphysis pubis.  I then placed a  _______ clamp anterior to the urethra and placed a 1-0 Vicryl tie around the dorsal vein complex.  I then divided the dorsal vein complex with the Bovie to expose the anterior urethra, which remained intact. There was a moderate amount of bleeding from the dorsal vein complex region which  was somewhat fixed due to the effects of radiation.  I therefore used 2-0 Vicryl suture to place figure-of-eight stitches in this area in order to control bleeding. I then placed a right angle clamp beneath the urethra and an umbilical tape was  placed here.  I divided the urethra with a knife anteriorly, brought the catheter into the wound and divided it, and then divided the inferior urethra. Denonvilliers fascia  could then be seen beneath this area.  I was able to pass  right angle clamp just beneath Denonvilliers fascia and anterior to the rectum nd divided it.  Access was then afforded to the plane between Denonvilliers fascia and the rectum.  Using blunt technique, I freed the prostate from the rectum.  The rectum was noted to be much more pale than usual due to the effects of radiation, but remained intact.  I then isolated the lateral pedicles to the prostate with a right angle clamp, placing hemoclips on the vascular pedicles and dividing them. Due to the great deal of scarring and bilateralality of the disease, as well as  high PSA, the neurovascular bundles were not spared on either side.  Once the prostate was fully elevated, I then incised over the seminal vesicles and ampulla of the vas.  Through Denonvilliers fascia, I developed this further bluntly. The ampulla of vas were clipped and divided and the seminal vesicles were then dissected to their apices where a clip was applied and this was divided.  Attention was then directed to the anterior junction between the prostate and the bladder where this was divided, freeing the prostate from the bladder neck. The remaining lateral pedicles were clipped and divided and the specimen was freed nd sent to pathology.  I then closed the bladder in a tennis racket fashion first ith a running 2-0 chromic suture.  The bladder mucosa was then everted circumferentially with interrupted 4-0 chromic sutures.  No further bleeding was identified.  I therefore placed a new catheter per urethra and then placed interrupted 2-0 Vicryl sutures in the bladder neck at the 5, 7, 2, 10, and 12 oclock positions and then placed these into the urethra to corresponding locations.  The catheter was placed in the bladder after a 1-0 Prolene was tied to the eye of the catheter and brought through the bladder neck and out through the dome of the bladder.   Water 15 cc was used to fill the catheter.  The retractors were removed and the bladder was allowed to fall down into its normal position ith mild traction being placed on the Foley catheter.  All bladder neck sutures were  tied.  The bladder was irrigated and noted to be free of clots with no extravasation of irrigant.  I then placed a Blake drain through a separate stab  incision in the left lower quadrant in the area of the anastomosis and secured t to the skin with a 2-0 silk suture and brought the Prolene suture through the right lower quadrant skin and secured that over a button.  The wound was copiously irrigated with saline and the fascia was closed with running 1-0 PDS suture. The subcutaneous tissues were copiously irrigated and the skin closed with skin staples.  The Foley catheter was connected to sterile closed system drainage and the drain connected to bulb suction.  The patient tolerated the procedure well.  There were no intraoperative complications.  The needle, sponge, and instrument  counts were reportedly correct x 2 at the end  of the operation. Attending Physician:  Trisha Mangle DD:  02/22/99 TD:  02/23/99 Job: [id number]

## 2010-07-12 NOTE — H&P (Signed)
[place]. [facility]  Patient:    James Harvey                       MRN: [id number] Adm. Date:  [id number] Attending:  Trisha Mangle CC:         Winn Jock. Earl Gala, M.D.                         History and Physical  CHIEF COMPLAINT:  Prostate cancer.  HISTORY:  This 77 year old white male patient was originally diagnosed with adenocarcinoma of the prostate by transrectal ultrasound and biopsy in December  1996.  It revealed Gleason 6 adenocarcinoma in the left lobe of the prostate gland, and at that time a bone scan as well as CT scan revealed no evidence of metastatic disease.  My original recommendation was a radical prostatectomy; however, the patient chose external beam radiation therapy which he underwent February-April  1997.  His PSA at the time of his diagnosis was 13.3.  After radiation, it fell to a nadir of only 2.8 in June 1998.  After that time, it began a slow, steady rise, and in March 2000 was 6.27.  By October of this year, it had risen to 11.59. At that time, the patient underwent repeat biopsy which revealed Gleason score adenocarcinoma 7 (3+4) on the left side and Gleason 6 (3+3) on the right side; 5% of biopsy material was involved with cancer on the right and 30% was involved on the left-hand side.  The patient has had all of the possible options discussed t length, and we have gone over in great detail the fact that surgical treatment would give him his best chance of cure, especially due to his young age and moderate to high-grade lesion.  This is fully outlined in my office notes which  have been placed on the chart.  PAST MEDICAL HISTORY: 1. Adenocarcinoma of the prostate. 2. Erectile dysfunction. 3. Left spermatocele, asymptomatic. 4. Arthritis in the right shoulder. 5. Pelvic floor musculature spasms/prostatodynia.  PAST SURGICAL HISTORY:  He has had a vasectomy in 1994, otherwise no surgeries.  CURRENT  MEDICATIONS:  Xanax p.r.n.  Aspirin 325 mg b.i.d., last dose was December 28.  Vitamins, Metamucil, shark cartilage, and protein.  ALLERGIES:  No known drug allergies.  SOCIAL HISTORY:  He denies chemical or substance abuse.  He also does not drink or smoke.  FAMILY HISTORY:  Negative for GU malignancy or renal disease.   His brother had BPH and required a TURP, but no cancer was found.  His father has had hypertension.  REVIEW OF SYSTEMS:  Negative for any pulmonary or cardiovascular complaints. He occasionally has a little difficulty swallowing.  He says the he feels this is usually due to his nerves, otherwise negative per Health History Assessment, Section 2.  PHYSICAL EXAMINATION:  VITAL SIGNS:  Blood pressure 150/80, pulse 72 and regular, respirations 16, temperature 97.8, weight 203, height 6 feet.  GENERAL:  The patient is a well-developed, well-nourished male in no apparent distress.  SKIN:  Warm and dry.  HEENT:  Atraumatic, normocephalic.  PERRLA.  EOMI.  Oropharynx is clear.  NECK:  He has no JVD.  No thyromegaly is noted.  ADENOPATHY:  He has no cervical, axillary, or inguinal adenopathy.  LUNGS:  Clear to auscultation bilaterally.  CARDIOVASCULAR:  Regular rate and rhythm without murmur.  ABDOMEN:  Soft and nontender without mass or hepatosplenomegaly.  He has no CVAT.  PELVIC:  He has a normal uncircumcised phallus without lesions or discharge. The scrotum is normal.  Both testicles are descended.  There is a small cystic lesion along the lateral border junction between the testicle and the epididymis on the left.  The right epididymis is normal.  No inguinal hernias are noted.  He has a normal anus and perineum.  Rectal tone is normal.  No rectal masses were palpated.  His prostate is slightly larger on the left than the right which it as been in the past.  No nodularity or induration or disease palpable outside the prostate was noted.  There  was no seminal vesicle abnormality present.  EXTREMITIES:  Without clubbing, cyanosis, or edema other than partial missing ring finger as noted above.  NEUROLOGIC:  No focal neurologic deficits.  He is alert and oriented with appropriate mood and affect.  LABORATORY DATA:  His workup included a transrectal ultrasound that revealed no  evidence of gross disease outside of the prostate gland.  He has undergone a CT scan of the abdomen and pelvic which revealed no metastatic disease.  He has a small upper pole right caliceal diverticulum and a small upper pole right renal cyst.  He has some sigmoid colonic diverticulosis, but no adenopathy is noted in the pelvis.  A whole body bone scan revealed degenerative arthritic uptake in the knees and he right AC joint.  No evidence of metastatic disease present.  EKG reveals normal sinus rhythm, left anterior fascicular block and some left ventricular hypertrophy with QRS widening but no acute ST or T wave changes.  Laboratory results: His white count was 7.3, hemoglobin 14.9,  hematocrit 43.3,  platelets 214,000.  PT 13.6, PTT 29, INR 1.1.  Sodium 137, potassium 4.5, chloride 106, CO2 26, glucose 114, BUN 21, creatinine 0.8.  Liver function tests were entirely normal.  IMPRESSION:  Mr. Keirsey and I have had long and detailed discussions about the fact that he appears to have organ-confined recurrent adenocarcinoma of the prostate despite external beam radiation therapy.  We have explored nonsurgical  options such as hormonal therapy, watchful waiting, antiandrogen therapy, and other forms of nonsurgical therapy for prostate cancer.  It is my feeling that his best chance at long-term survival would be with a radical prostatectomy, and he understands this.  He also understands the significant increase of risk involved with this surgery now that he had had radiation therapy.  Again, this has been fully outlined in my office notes  and is on the chart.  He understands this and has elected to proceed with surgical therapy.  PLAN: 1. He has undergone full GoLYTELY mechanical bowel prep.  2. DVT prophylaxis will be undertaken with PAS and TED hose as well as early    ambulation. 3. IV Unasyn 3 g preoperatively. 4. Proceed with radical retropubic prostatectomy and bilateral pelvic lymph node    dissection under general anesthesia. DD:  02/22/99 TD:  02/22/99 Job: [id number]

## 2010-08-14 ENCOUNTER — Ambulatory Visit
Admission: RE | Admit: 2010-08-14 | Discharge: 2010-08-14 | Disposition: A | Discharge status: Home / Self Care | Payer: Medicare Other | Source: Ambulatory Visit | Attending: Family Medicine | Admitting: Family Medicine

## 2010-08-14 ENCOUNTER — Other Ambulatory Visit: Payer: Self-pay | Admitting: Family Medicine

## 2010-08-14 DIAGNOSIS — F0390 Unspecified dementia without behavioral disturbance: Secondary | ICD-10-CM

## 2010-08-14 DIAGNOSIS — H21569 Pupillary abnormality, unspecified eye: Secondary | ICD-10-CM

## 2010-08-14 DIAGNOSIS — W19XXXA Unspecified fall, initial encounter: Secondary | ICD-10-CM

## 2010-08-15 ENCOUNTER — Other Ambulatory Visit: Payer: Self-pay | Admitting: Family Medicine

## 2010-08-15 DIAGNOSIS — N19 Unspecified kidney failure: Secondary | ICD-10-CM

## 2010-08-15 DIAGNOSIS — C61 Malignant neoplasm of prostate: Secondary | ICD-10-CM

## 2010-08-19 ENCOUNTER — Ambulatory Visit
Admission: RE | Admit: 2010-08-19 | Discharge: 2010-08-19 | Disposition: A | Discharge status: Home / Self Care | Payer: Medicare Other | Source: Ambulatory Visit | Attending: Family Medicine | Admitting: Family Medicine

## 2010-08-19 DIAGNOSIS — N19 Unspecified kidney failure: Secondary | ICD-10-CM

## 2010-08-19 DIAGNOSIS — C61 Malignant neoplasm of prostate: Secondary | ICD-10-CM

## 2010-10-07 ENCOUNTER — Emergency Department (HOSPITAL_COMMUNITY)
Admission: EM | Admit: 2010-10-07 | Discharge: 2010-10-08 | Disposition: A | Discharge status: Home / Self Care | Payer: Medicare Other | Attending: Emergency Medicine | Admitting: Emergency Medicine

## 2010-10-07 DIAGNOSIS — T18108A Unspecified foreign body in esophagus causing other injury, initial encounter: Secondary | ICD-10-CM | POA: Insufficient documentation

## 2010-10-07 DIAGNOSIS — Z951 Presence of aortocoronary bypass graft: Secondary | ICD-10-CM | POA: Insufficient documentation

## 2010-10-07 DIAGNOSIS — IMO0002 Reserved for concepts with insufficient information to code with codable children: Secondary | ICD-10-CM | POA: Insufficient documentation

## 2010-10-07 DIAGNOSIS — I2510 Atherosclerotic heart disease of native coronary artery without angina pectoris: Secondary | ICD-10-CM | POA: Insufficient documentation

## 2010-10-07 DIAGNOSIS — R1310 Dysphagia, unspecified: Secondary | ICD-10-CM | POA: Insufficient documentation

## 2010-10-08 ENCOUNTER — Emergency Department (HOSPITAL_COMMUNITY): Payer: Medicare Other

## 2011-04-22 ENCOUNTER — Ambulatory Visit
Admission: RE | Admit: 2011-04-22 | Discharge: 2011-04-22 | Disposition: A | Discharge status: Home / Self Care | Payer: Medicare Other | Source: Ambulatory Visit | Attending: Family Medicine | Admitting: Family Medicine

## 2011-04-22 ENCOUNTER — Other Ambulatory Visit: Payer: Self-pay | Admitting: Family Medicine

## 2011-04-22 DIAGNOSIS — W19XXXA Unspecified fall, initial encounter: Secondary | ICD-10-CM

## 2012-01-12 ENCOUNTER — Emergency Department (HOSPITAL_COMMUNITY): Payer: Medicare Other

## 2012-01-12 ENCOUNTER — Encounter (HOSPITAL_COMMUNITY): Payer: Self-pay | Admitting: Emergency Medicine

## 2012-01-12 ENCOUNTER — Emergency Department (HOSPITAL_COMMUNITY)
Admission: EM | Admit: 2012-01-12 | Discharge: 2012-01-12 | Disposition: A | Discharge status: Home / Self Care | Payer: Medicare Other | Attending: Emergency Medicine | Admitting: Emergency Medicine

## 2012-01-12 DIAGNOSIS — Z79899 Other long term (current) drug therapy: Secondary | ICD-10-CM | POA: Insufficient documentation

## 2012-01-12 DIAGNOSIS — R1010 Upper abdominal pain, unspecified: Secondary | ICD-10-CM

## 2012-01-12 DIAGNOSIS — Z7982 Long term (current) use of aspirin: Secondary | ICD-10-CM | POA: Insufficient documentation

## 2012-01-12 DIAGNOSIS — Z8546 Personal history of malignant neoplasm of prostate: Secondary | ICD-10-CM | POA: Insufficient documentation

## 2012-01-12 DIAGNOSIS — I442 Atrioventricular block, complete: Secondary | ICD-10-CM | POA: Insufficient documentation

## 2012-01-12 DIAGNOSIS — I10 Essential (primary) hypertension: Secondary | ICD-10-CM | POA: Insufficient documentation

## 2012-01-12 DIAGNOSIS — R109 Unspecified abdominal pain: Secondary | ICD-10-CM | POA: Insufficient documentation

## 2012-01-12 DIAGNOSIS — Z8659 Personal history of other mental and behavioral disorders: Secondary | ICD-10-CM | POA: Insufficient documentation

## 2012-01-12 DIAGNOSIS — I2510 Atherosclerotic heart disease of native coronary artery without angina pectoris: Secondary | ICD-10-CM | POA: Insufficient documentation

## 2012-01-12 HISTORY — DX: Anxiety disorder, unspecified: F41.9

## 2012-01-12 LAB — URINE MICROSCOPIC-ADD ON

## 2012-01-12 LAB — HEPATIC FUNCTION PANEL
AST: 21 U/L (ref 0–37)
Albumin: 4.2 g/dL (ref 3.5–5.2)
Total Protein: 8 g/dL (ref 6.0–8.3)

## 2012-01-12 LAB — BASIC METABOLIC PANEL
CO2: 24 mEq/L (ref 19–32)
Chloride: 104 mEq/L (ref 96–112)
Creatinine, Ser: 1.04 mg/dL (ref 0.50–1.35)

## 2012-01-12 LAB — CBC WITH DIFFERENTIAL/PLATELET
Basophils Absolute: 0 10*3/uL (ref 0.0–0.1)
HCT: 41.3 % (ref 39.0–52.0)
Hemoglobin: 14.2 g/dL (ref 13.0–17.0)
Lymphocytes Relative: 13 % (ref 12–46)
Monocytes Absolute: 0.4 10*3/uL (ref 0.1–1.0)
Monocytes Relative: 3 % (ref 3–12)
Neutro Abs: 10.8 10*3/uL — ABNORMAL HIGH (ref 1.7–7.7)
RDW: 13.6 % (ref 11.5–15.5)
WBC: 12.9 10*3/uL — ABNORMAL HIGH (ref 4.0–10.5)

## 2012-01-12 LAB — TROPONIN I: Troponin I: <0.3 ng/mL (ref <0.30)

## 2012-01-12 LAB — URINALYSIS, ROUTINE W REFLEX MICROSCOPIC
Bilirubin Urine: NEGATIVE
Leukocytes, UA: NEGATIVE
Nitrite: POSITIVE — AB
Specific Gravity, Urine: 1.018 (ref 1.005–1.030)
pH: 7.5 (ref 5.0–8.0)

## 2012-01-12 MED ORDER — MORPHINE SULFATE 4 MG/ML IJ SOLN
4.0000 mg | Freq: Once | INTRAMUSCULAR | Status: AC
  Administered 2012-01-12: 4 mg via INTRAVENOUS
  Filled 2012-01-12: qty 1

## 2012-01-12 MED ORDER — PANTOPRAZOLE SODIUM 40 MG IV SOLR
40.0000 mg | Freq: Once | INTRAVENOUS | Status: AC
  Administered 2012-01-12: 40 mg via INTRAVENOUS
  Filled 2012-01-12: qty 40

## 2012-01-12 MED ORDER — ONDANSETRON HCL 4 MG/2ML IJ SOLN
4.0000 mg | Freq: Once | INTRAMUSCULAR | Status: AC
  Administered 2012-01-12: 4 mg via INTRAVENOUS
  Filled 2012-01-12: qty 2

## 2012-01-12 NOTE — ED Notes (Signed)
Per EMS: GI pain since this am, RLQ to periumbilical to LLQ. N/V x1, BM x3 soft stool. Comes from doctor's office where pt received 324 aspirin, 2 inches nitro paste, no significant 12-lead, pt has a AV paced-rhythm. Pt A&Ox4, ambulatory.

## 2012-01-12 NOTE — ED Provider Notes (Signed)
History     CSN: [id number]  Arrival date & time 01/12/12  1112   First MD Initiated Contact with Patient 01/12/12 1113      Chief Complaint  Patient presents with  . GI Problem    (Consider location/radiation/quality/duration/timing/severity/associated sxs/prior treatment) HPI  78 year old male with history of coronary artery disease, with pacemaker for complete heart block presents to ED for complaints of abdominal pain. Patient reports he woke up this morning with pain to his upper abdomen. Describe pain as a sharp and stabbing sensation, nonradiating, worsening when he lies flat, moderate in severity, and is resolving. Patient tries to vomited once and also had several bowel movements with initially no relief.  He denies any significant chest pain, shortness of breath, nausea, or diaphoresis. He show up at his doctor's office for evaluation, and when the Dr. notice wide-complex on his EKG, he was sent to the ED for further evaluation. Patient also received aspirins, and nitro paste.  EMS noticed that the EKG is of a paced rhythm. Patient otherwise denies any significant discomfort at this time. Patient has prior abdominal surgery, his gallbladder is intact. He does report eating "alot of food last night that I shouldn't eat".    Past Medical History  Diagnosis Date  . CAD (coronary artery disease)     s/p CABG 02/15/2010 (single vessel)  . Atrioventricular block, complete 02/19/10    s/p PPM by JA  . Hypertension   . Prostate cancer   . Anxiety     Past Surgical History  Procedure Date  . Pacemaker insertion 02/19/10    by Fawn Kirk for complete heart block    Family History  Problem Relation Age of Onset  . Hypertension      History  Substance Use Topics  . Smoking status: Never Smoker   . Smokeless tobacco: Not on file  . Alcohol Use: Yes      Review of Systems  All other systems reviewed and are negative.    Allergies  Review of patient's allergies indicates  no known allergies.  Home Medications   Current Outpatient Rx  Name  Route  Sig  Dispense  Refill  . ALPRAZOLAM 1 MG PO TABS   Oral   Take 1 mg by mouth at bedtime as needed.           . ASPIRIN 81 MG PO TABS   Oral   Take 81 mg by mouth daily.           Marland Kitchen BIMATOPROST 0.03 % OP SOLN   Left Eye   Place 1 drop into the left eye at bedtime.           . DORZOLAMIDE HCL-TIMOLOL MAL 22.3-6.8 MG/ML OP SOLN   Left Eye   Place 1 drop into the left eye 2 (two) times daily.           Marland Kitchen LISINOPRIL 5 MG PO TABS   Oral   Take 5 mg by mouth daily.           Marland Kitchen METOPROLOL SUCCINATE ER 25 MG PO TB24   Oral   Take 25 mg by mouth 2 (two) times daily.           Marland Kitchen ONE-DAILY MULTI VITAMINS PO TABS   Oral   Take 1 tablet by mouth daily.           Marland Kitchen ROSUVASTATIN CALCIUM 20 MG PO TABS   Oral   Take 20 mg by mouth daily.           Marland Kitchen  TRAMADOL HCL ER 100 MG PO TB24   Oral   Take 100 mg by mouth every 4 (four) hours as needed.             BP 155/87  Pulse 62  Temp 97.6 F (36.4 C) (Oral)  Resp 20  Ht 5\' 11"  (1.803 m)  Wt 206 lb (93.441 kg)  BMI 28.73 kg/m2  SpO2 99%  Physical Exam  Nursing note and vitals reviewed. Constitutional: He appears well-developed and well-nourished. No distress.       Awake, alert, nontoxic appearance  HENT:  Head: Atraumatic.  Eyes: Conjunctivae normal are normal. Right eye exhibits no discharge. Left eye exhibits no discharge.  Neck: Normal range of motion. Neck supple.  Cardiovascular: Normal rate and regular rhythm.   Pulmonary/Chest: Effort normal. No respiratory distress. He has no wheezes. He has no rales. He exhibits no tenderness.  Abdominal: Soft. Bowel sounds are normal. He exhibits distension. There is no tenderness. There is no rebound and no guarding.  Musculoskeletal: He exhibits no edema and no tenderness.       ROM appears intact, no obvious focal weakness  Neurological: He is alert.  Skin: Skin is warm and dry. No  rash noted.  Psychiatric: He has a normal mood and affect.    ED Course  Procedures (including critical care time)  Labs Reviewed - No data to display No results found.   No diagnosis found.   Date: 01/12/2012  Rate: 66  Rhythm: normal sinus rhythm  QRS Axis: left  Intervals: normal  ST/T Wave abnormalities: nonspecific ST changes  Conduction Disutrbances:nonspecific intraventricular conduction delay  Narrative Interpretation: non paced, as compare to paced rhythm on dec 28th, 2011  Old EKG Reviewed: changes noted  Results for orders placed during the hospital encounter of 01/12/12  CBC WITH DIFFERENTIAL      Component Value Range   WBC 12.9 (*) 4.0 - 10.5 K/uL   RBC 4.43  4.22 - 5.81 MIL/uL   Hemoglobin 14.2  13.0 - 17.0 g/dL   HCT 57.8  46.9 - 62.9 %   MCV 93.2  78.0 - 100.0 fL   MCH 32.1  26.0 - 34.0 pg   MCHC 34.4  30.0 - 36.0 g/dL   RDW 52.8  41.3 - 24.4 %   Platelets 154  150 - 400 K/uL   Neutrophils Relative 84 (*) 43 - 77 %   Neutro Abs 10.8 (*) 1.7 - 7.7 K/uL   Lymphocytes Relative 13  12 - 46 %   Lymphs Abs 1.7  0.7 - 4.0 K/uL   Monocytes Relative 3  3 - 12 %   Monocytes Absolute 0.4  0.1 - 1.0 K/uL   Eosinophils Relative 0  0 - 5 %   Eosinophils Absolute 0.0  0.0 - 0.7 K/uL   Basophils Relative 0  0 - 1 %   Basophils Absolute 0.0  0.0 - 0.1 K/uL  BASIC METABOLIC PANEL      Component Value Range   Sodium 139  135 - 145 mEq/L   Potassium 4.5  3.5 - 5.1 mEq/L   Chloride 104  96 - 112 mEq/L   CO2 24  19 - 32 mEq/L   Glucose, Bld 143 (*) 70 - 99 mg/dL   BUN 18  6 - 23 mg/dL   Creatinine, Ser 0.10  0.50 - 1.35 mg/dL   Calcium 27.2  8.4 - 53.6 mg/dL   GFR calc non Af Amer 67 (*) >90 mL/min  GFR calc Af Amer 78 (*) >90 mL/min  TROPONIN I      Component Value Range   Troponin I <0.30  <0.30 ng/mL  HEPATIC FUNCTION PANEL      Component Value Range   Total Protein 8.0  6.0 - 8.3 g/dL   Albumin 4.2  3.5 - 5.2 g/dL   AST 21  0 - 37 U/L   ALT 19  0 - 53  U/L   Alkaline Phosphatase 61  39 - 117 U/L   Total Bilirubin 0.3  0.3 - 1.2 mg/dL   Bilirubin, Direct <0.8  0.0 - 0.3 mg/dL   Indirect Bilirubin NOT CALCULATED  0.3 - 0.9 mg/dL   Dg Abd Acute W/chest  01/12/2012  *RADIOLOGY REPORT*  Clinical Data: upper abdominal pain  ACUTE ABDOMEN SERIES (ABDOMEN 2 VIEW & CHEST 1 VIEW)  Comparison: 04/22/2011  Findings: Previous coronary bypass changes and left subclavian pacer.  Normal heart size.  Stable prominence without focal pneumonia, edema, collapse, consolidation pneumothorax.  No free air.  Scattered air and stool throughout the bowel.  Mild distention noted but no definite obstruction pattern or ileus. Degenerative changes of the spine.  Postop changes in the pelvis. Pelvic calcifications noted, nonspecific.  IMPRESSION: Postoperative findings in the chest and pelvis.  Nonobstructive bowel gas pattern.  No free air.   Original Report Authenticated By: Judie Petit. Shick, M.D.    1. Upper abdominal pain (?gastritis) 2. Hypertension    MDM  Pt presents with upper abdominal pain.  Does have cardiac hx.  Currently denies CP or SOB.  Work up initiated.  Care discussed with my attending.    2 sets of trop, ecg, and labs  2:43 PM On reexamination, pt has mild discomfort to his epigastric/ mid abdomen.  Non surgical abdomen.  Since pt eat a heavy meal yesterday, and now having abd pain which he thinks is related to over-ate, will give protonix, morphine and zofran as treatment.  Will check second set of cardiac enzyme.    3:59 PM Pt felt much better after receiving treatment.  Minimal abd pain and no chest pain. I suspect his pain is likely gastritis.Doubt biliary etiology at this time. Will check delta troponin and if negative, pt to f/u with [facility] cardiology for further evaluation.  Pt voice understanding and agrees with plan.  Pt also aware that his blood pressure is elevated today and will need to have it recheck.   BP 193/86  Pulse 71  Temp 97.6 F  (36.4 C) (Oral)  Resp 20  Ht 5\' 11"  (1.803 m)  Wt 206 lb (93.441 kg)  BMI 28.73 kg/m2  SpO2 98%  4:15 PM Report given to Dr. Freida Busman, and Herbert Seta PA-C who will dispo pt pending delta troponin.    I have reviewed nursing notes and vital signs. I personally reviewed the imaging tests through PACS system  I reviewed available ER/hospitalization records thought the EMR   Fayrene Helper, [place] 01/12/12 1623

## 2012-01-12 NOTE — ED Notes (Signed)
Explained to Patient we need a urine sample.  Left a urinal by bed side.

## 2012-01-12 NOTE — ED Provider Notes (Signed)
Medical screening examination/treatment/procedure(s) were conducted as a shared visit with non-physician practitioner(s) and myself.  I personally evaluated the patient during the encounter.   Derwood Kaplan, MD 01/12/12 1610

## 2012-01-14 LAB — URINE CULTURE

## 2012-01-15 NOTE — ED Notes (Signed)
+   Urine Chart sent to EDP Office for review. 

## 2012-01-18 ENCOUNTER — Telehealth (HOSPITAL_COMMUNITY): Payer: Self-pay | Admitting: Emergency Medicine

## 2012-01-18 NOTE — ED Notes (Signed)
Chart returned from EDP office. Prescribed Keflex 500 mg PO BID x 7 days. Prescribed by Trixie Dredge PA-C.

## 2012-01-23 NOTE — ED Notes (Signed)
Rx called in to [facility] [id number]) by Jaci Lazier PFM.

## 2012-07-10 IMAGING — CR DG CHEST 2V
2 series · 2 of 2 positions shown · non-contrast
Comparison: None.

CLINICAL DATA: Chest pain.  Heart block.

CHEST - 2 VIEW 02/14/2010:

[w chest pa]
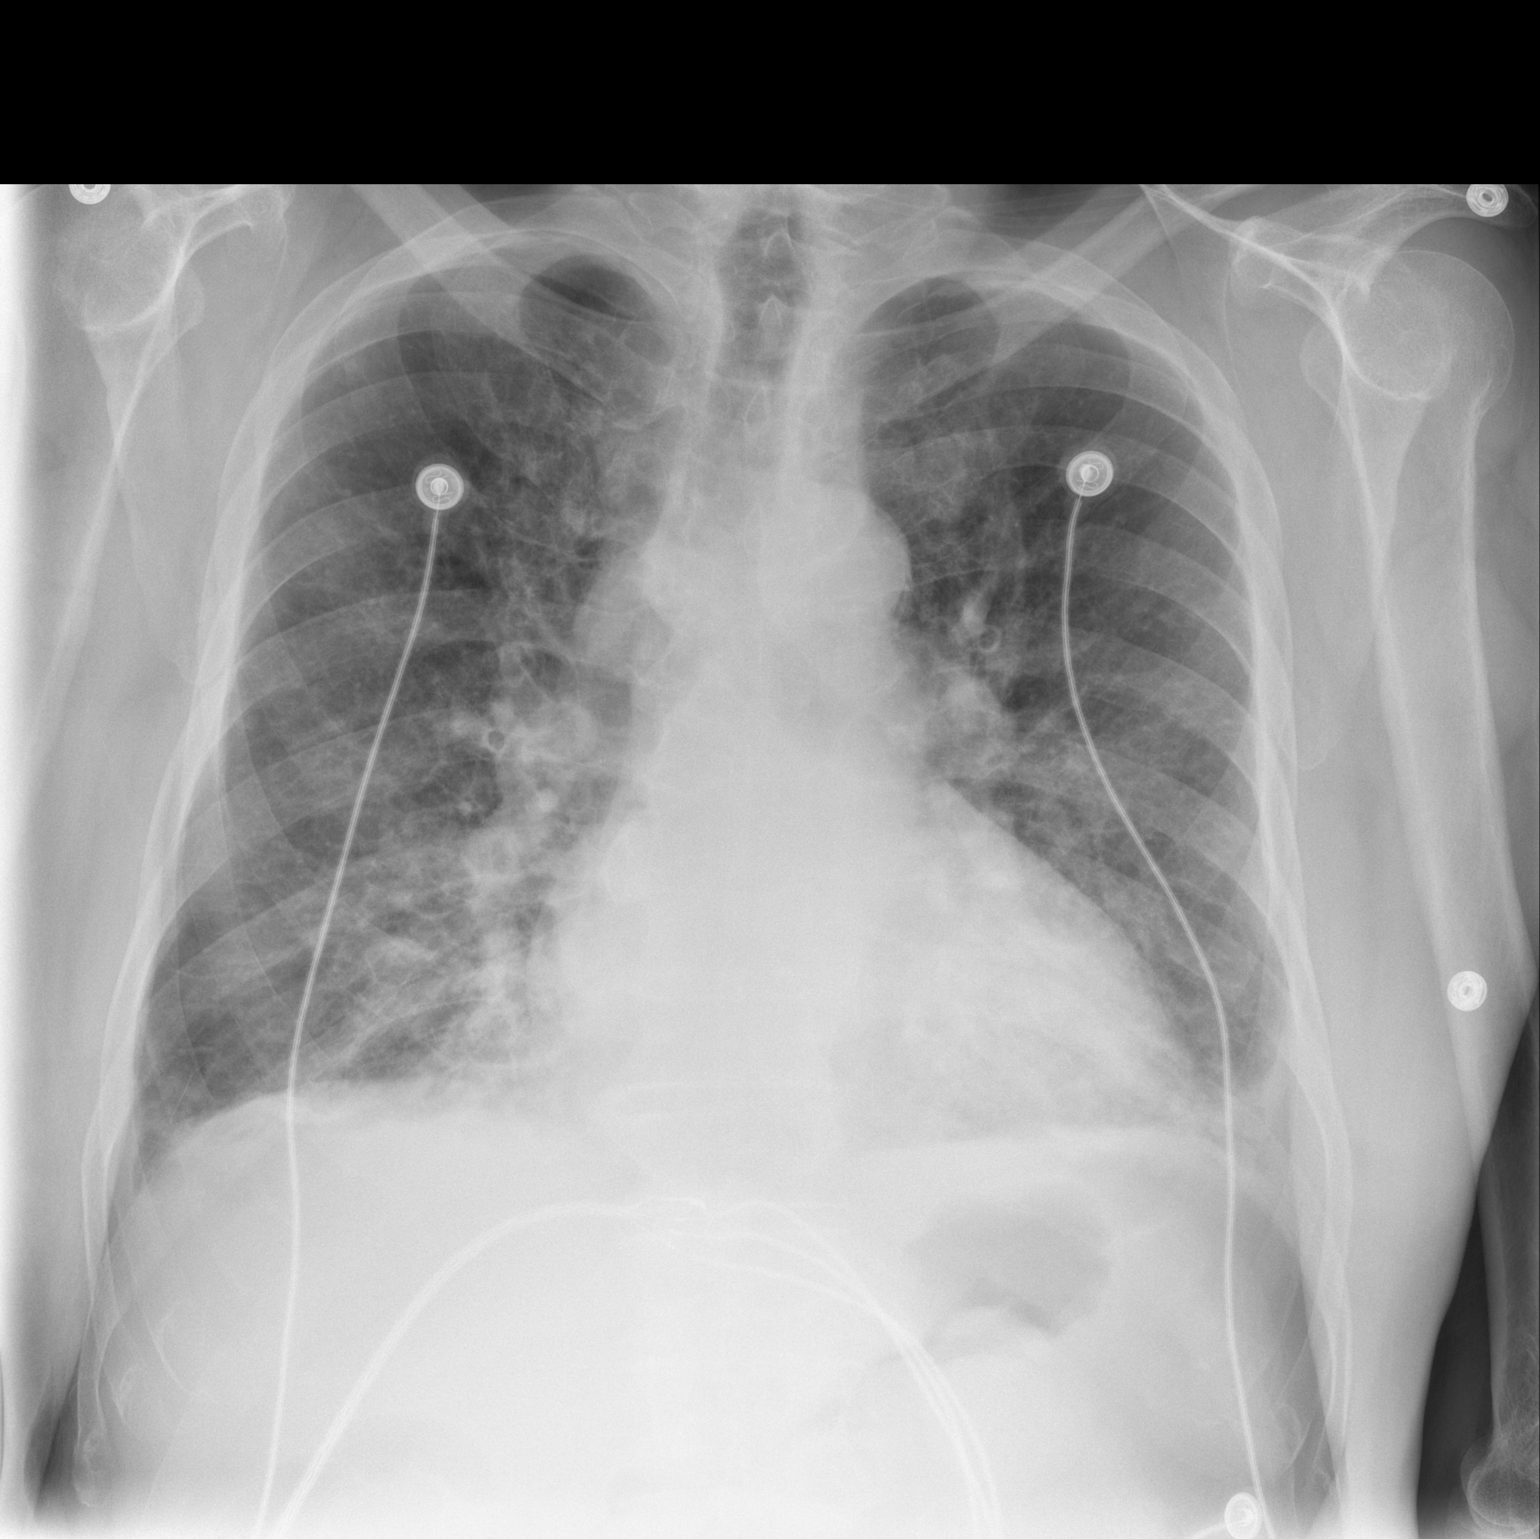

[w chest lat]
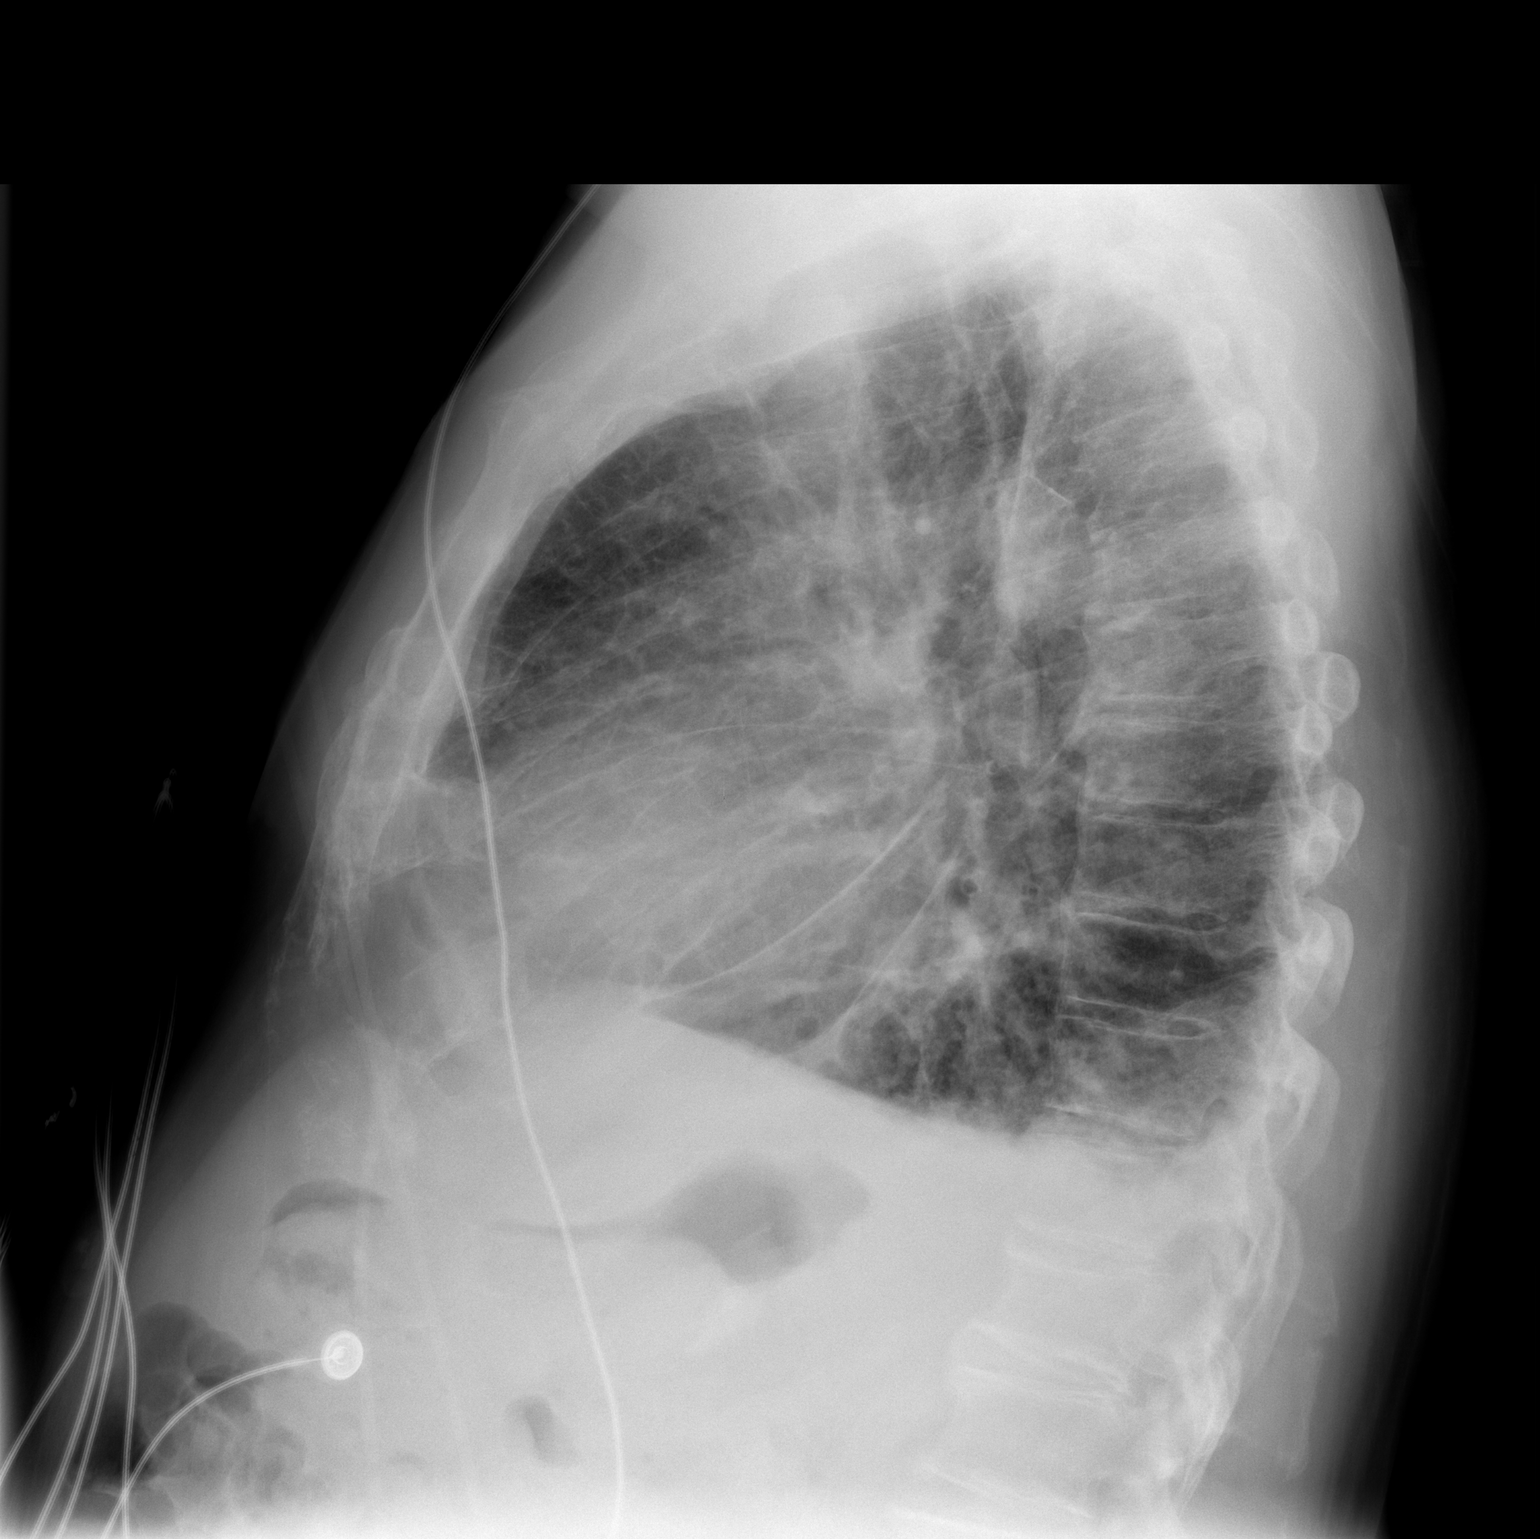

[2 of 2 positions shown; findings below may reference images not displayed]

FINDINGS: Cardiac silhouette mildly enlarged.  Moderate diffuse
interstitial pulmonary edema.  Small bilateral pleural effusions
and associated mild passive atelectasis in the lower lobes.
Thoracic aorta mildly tortuous and atherosclerotic.  Hilar and
mediastinal contours otherwise unremarkable.  Degenerative changes
involving the thoracic spine.
IMPRESSION: CHF with mild cardiomegaly and moderate diffuse interstitial
pulmonary edema.  Small bilateral pleural effusions and associated
passive atelectasis in the lower lobes.

## 2012-07-11 IMAGING — CR DG CHEST 1V PORT
1 series · 1 of 1 positions shown · non-contrast
Comparison: 02/14/2010

CLINICAL DATA: CABG.

PORTABLE CHEST - 1 VIEW

[view not recorded]
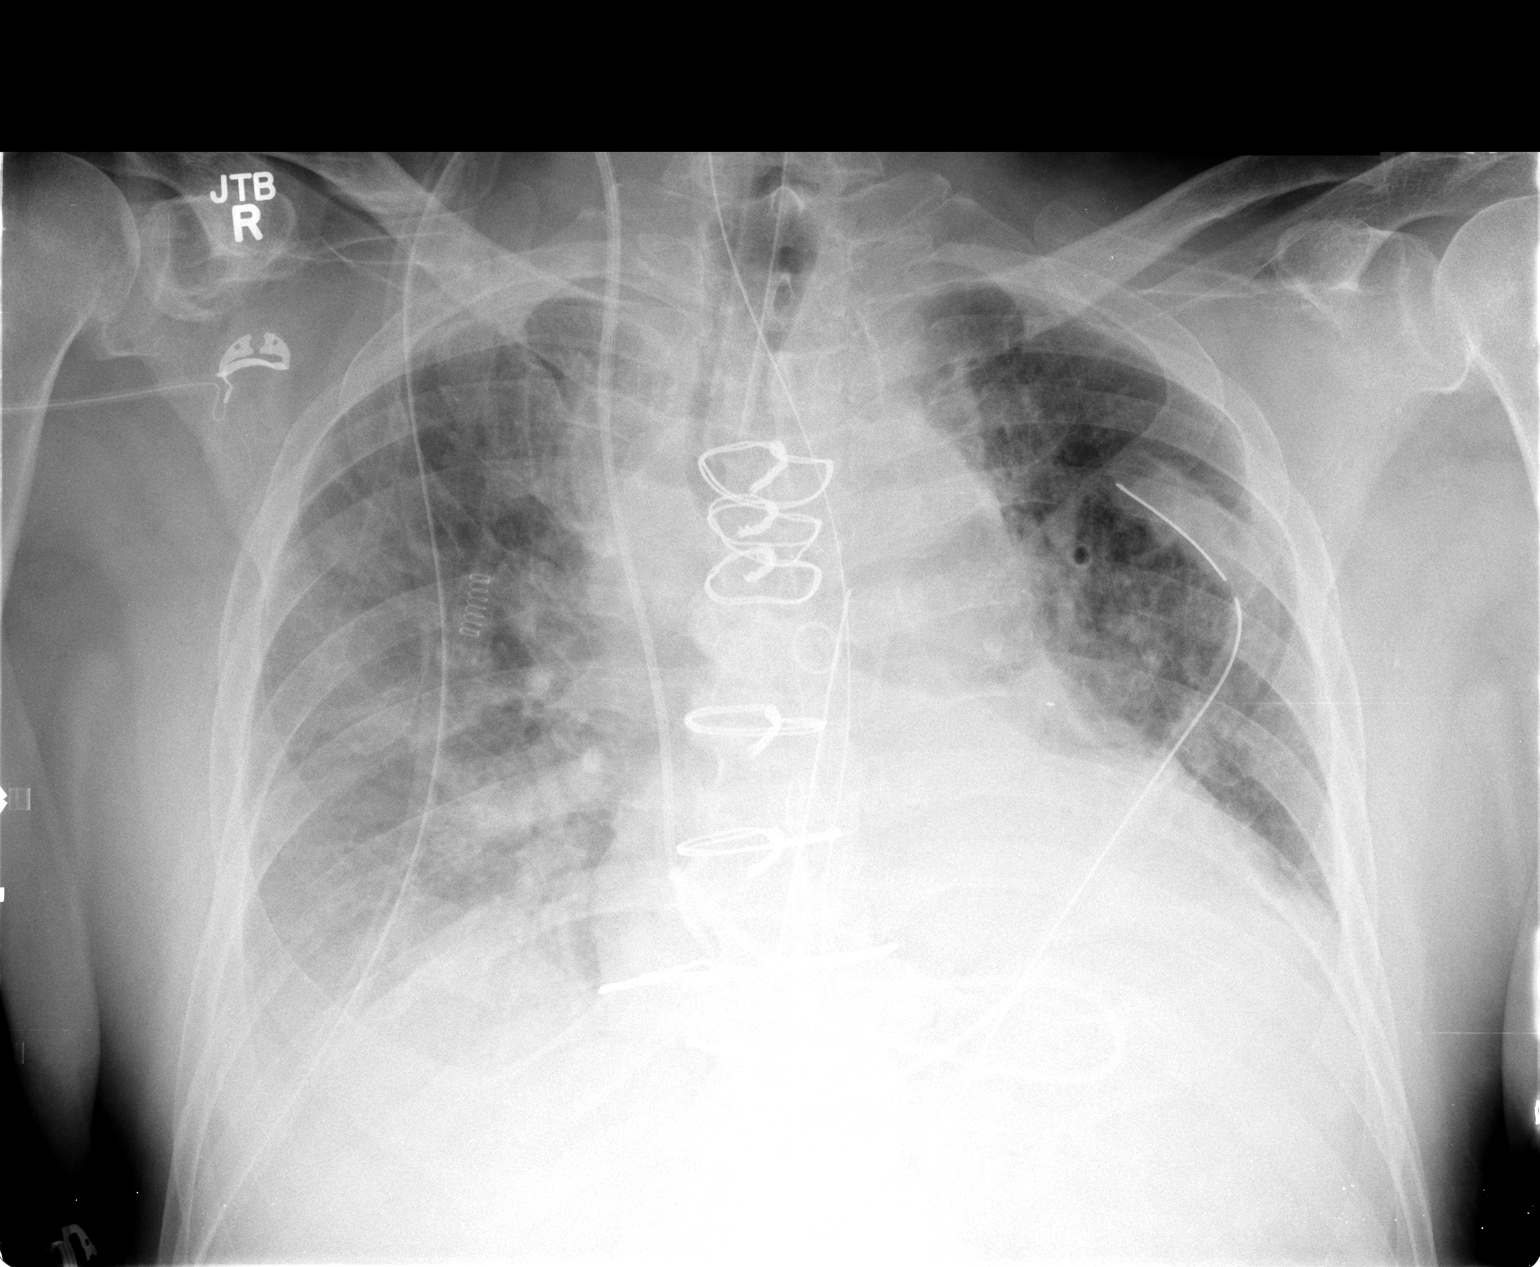

[1 of 1 positions shown; findings below may reference images not displayed]

FINDINGS: Postop median sternotomy.  Endotracheal tube in good
position.  Swan-Ganz catheter is coiled in the right ventricle and
right atrium and does not appear to enter the pulmonary artery.
Left chest tube is in place.  Negative for pneumothorax.

Increase in pulmonary edema and bilateral effusions since the prior
study.  Increase in bibasilar atelectasis.

NG tube is kinked in the proximal stomach with the tip in the
distal esophagus.
IMPRESSION: Increase in congestive heart failure and edema.  Increase in
bibasilar atelectasis.

Swan-Ganz catheter is coiled in the right ventricle and right
atrium.

NG tube is kinked in the distal esophagus.

## 2012-07-12 IMAGING — CR DG CHEST 1V PORT
1 series · 1 of 1 positions shown · non-contrast
Comparison: 02/15/2010.

CLINICAL DATA: Chest pain.  Heart block.  CABG.

PORTABLE CHEST - 1 VIEW

[view not recorded]
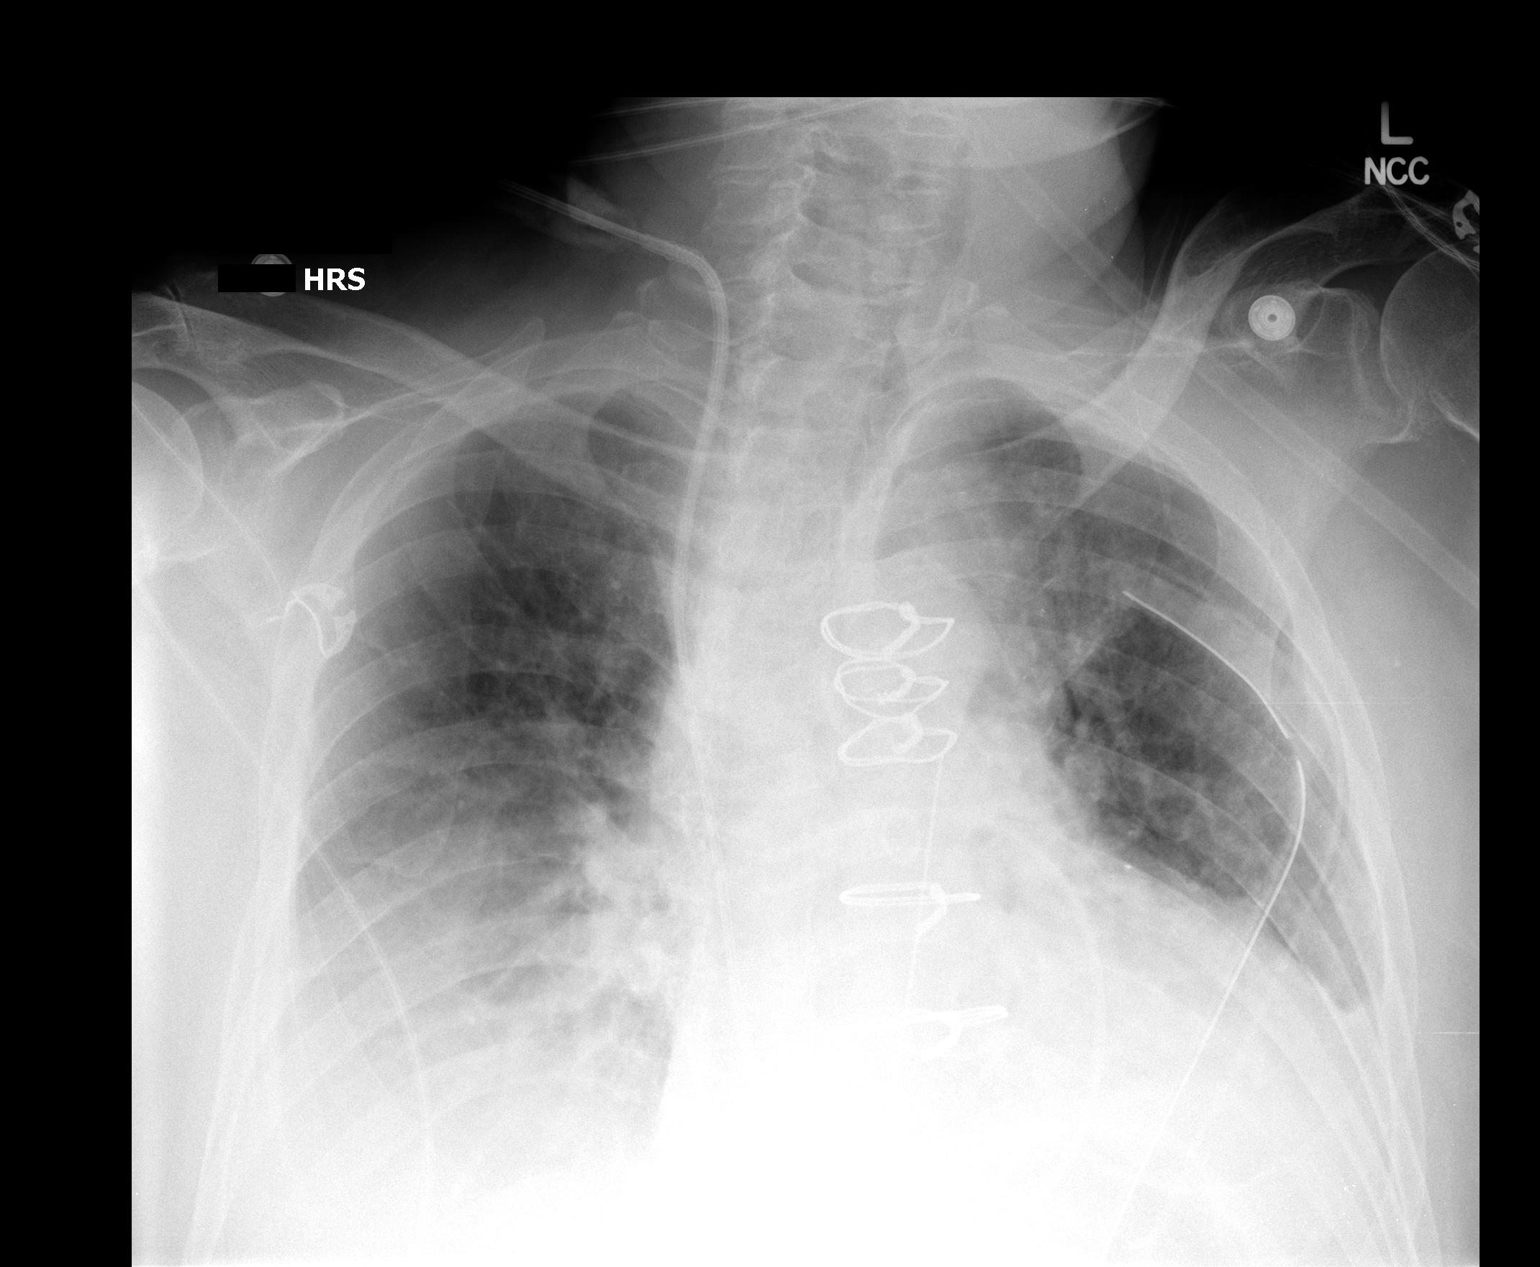

[1 of 1 positions shown; findings below may reference images not displayed]

FINDINGS: Right IJ Swan-Ganz catheter is present with the tip in
the main pulmonary artery.  Postoperative changes CABG.  Left
thoracostomy tube and mediastinal drains.  Endotracheal tube has
been removed.  Bilateral basilar airspace disease and atelectasis
is present with small bilateral pleural effusions.  Pulmonary
aeration appears little changed compared to prior.
IMPRESSION: Interval extubation with otherwise unchanged support apparatus.  No
significant change in pulmonary aeration.

## 2012-07-13 IMAGING — CR DG CHEST 1V PORT
1 series · 1 of 1 positions shown · non-contrast
Comparison: Portable chest x-ray of 02/16/2010

CLINICAL DATA: Chest pain, postop CABG, follow-up

PORTABLE CHEST - 1 VIEW

[view not recorded]
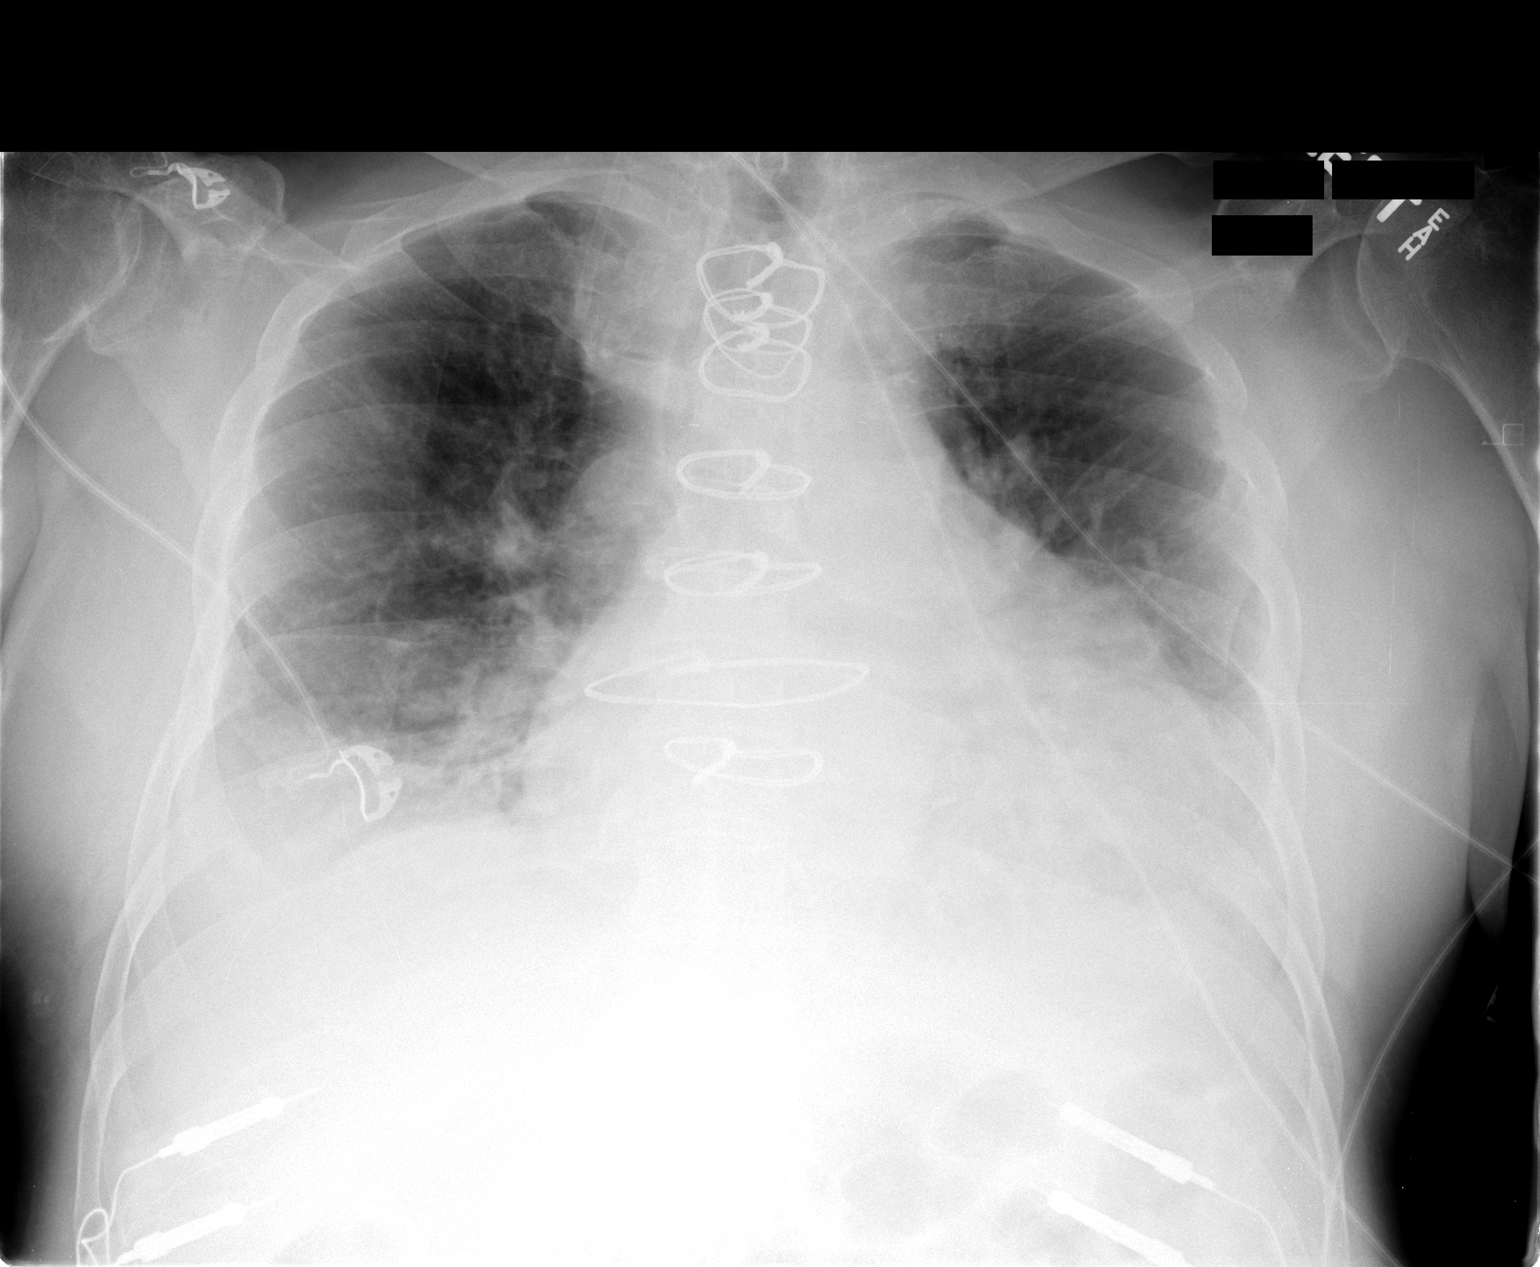

[1 of 1 positions shown; findings below may reference images not displayed]

FINDINGS: The left chest tube and the Swan-Ganz catheter have been
removed.  No pneumothorax is seen.  There is cardiomegaly present
with persistent mild pulmonary vascular congestion and small
effusions right greater than left.
IMPRESSION: Swan-Ganz catheter and left chest tube removed.  No pneumothorax.
No change in pulmonary vascular congestion.

## 2016-07-15 ENCOUNTER — Ambulatory Visit (INDEPENDENT_AMBULATORY_CARE_PROVIDER_SITE_OTHER): Payer: Medicare Other

## 2016-07-15 ENCOUNTER — Ambulatory Visit (INDEPENDENT_AMBULATORY_CARE_PROVIDER_SITE_OTHER): Payer: Medicare Other | Admitting: Orthopaedic Surgery

## 2016-07-15 ENCOUNTER — Encounter (INDEPENDENT_AMBULATORY_CARE_PROVIDER_SITE_OTHER): Payer: Self-pay | Admitting: Orthopaedic Surgery

## 2016-07-15 VITALS — BP 150/76 | HR 63 | Temp 98.3°F | Ht 70.0 in | Wt 209.0 lb

## 2016-07-15 DIAGNOSIS — M4722 Other spondylosis with radiculopathy, cervical region: Secondary | ICD-10-CM

## 2016-07-15 DIAGNOSIS — R200 Anesthesia of skin: Secondary | ICD-10-CM | POA: Diagnosis not present

## 2016-07-15 DIAGNOSIS — M542 Cervicalgia: Secondary | ICD-10-CM

## 2016-07-15 NOTE — Addendum Note (Signed)
Addended by: Meyer Cory on: 07/15/2016 09:56 AM   Modules accepted: Orders

## 2016-07-15 NOTE — Progress Notes (Signed)
Office Visit Note   Patient: James Harvey           Date of Birth: 1932-03-27           MRN: [id number] Visit Date: 07/15/2016              Requested by: No referring provider defined for this encounter. PCP: Tamsen Roers, MD   Assessment & Plan: Visit Diagnoses:  1. Neck pain   2. Bilateral hand numbness   3. Other spondylosis with radiculopathy, cervical region     Plan: We'll proceed with EMGs nerve conduction velocities to evaluate him for cubital tunnel and carpal tunnel bilaterally. I discussed with them that this may be related to nerve compression problem. If his electrical tests suggest this is coming from her cervical spine he may require a cervical MRI scan.  Follow-Up Instructions: Follow-up after EMGs nerve conduction velocities.  Orders:  Orders Placed This Encounter  Procedures  . XR Cervical Spine 2 or 3 views   No orders of the defined types were placed in this encounter.     Procedures: No procedures performed   Clinical Data: No additional findings.   Subjective: Chief Complaint  Patient presents with  . Right Hand - Numbness, Weakness    HPI patient returns not seen him in over year and states his in persistent increasing numbness in his hands with weakness. He drops objects it wakes him up at night at times most times on the radial side of his hand really worse on the right hand and now also present in the left hand. He's also noticed numbness in his small finger as well with weakness of grip which is progressed. He's had CABG procedure has a pacemaker since I saw him. He's not had a cervical MRI.Marland Kitchen Nerve conduction velocities for carpal tunnel and did not have a surgery recommended at that time. He seen Dr. Daryll Brod. No gait disturbance. At 89 wakes up and shakes his hands which seem to help. Negative history for falling.He has noticed that his balance isn't as good some activities like walking on a log is not able to do.  Review of Systems    Constitutional: Negative for chills and diaphoresis.  HENT: Negative for ear discharge, ear pain and nosebleeds.   Eyes: Negative for discharge and visual disturbance.  Respiratory: Negative for cough, choking and shortness of breath.   Cardiovascular: Negative for chest pain and palpitations.       Positive for CABG procedure he has a pacemaker  Gastrointestinal: Negative for abdominal distention and abdominal pain.  Endocrine: Negative for cold intolerance and heat intolerance.  Genitourinary: Negative for flank pain and hematuria.  Skin: Negative for rash and wound.  Neurological: Negative for seizures and speech difficulty.  Hematological: Negative for adenopathy. Does not bruise/bleed easily.  Psychiatric/Behavioral: Negative for agitation and suicidal ideas.   Negative history for diabetes.  Objective: Vital Signs: BP (!) 150/76 (BP Location: Right Arm, Patient Position: Sitting, Cuff Size: Normal)   Pulse 63   Temp 98.3 F (36.8 C) (Oral)   Ht 5\' 10"  (1.778 m)   Wt 209 lb (94.8 kg)   BMI 29.99 kg/m   Physical Exam  Constitutional: He is oriented to person, place, and time. He appears well-developed and well-nourished.  HENT:  Head: Normocephalic and atraumatic.  Eyes: EOM are normal. Pupils are equal, round, and reactive to light.  Neck: No tracheal deviation present. No thyromegaly present.  Cardiovascular: Normal rate.   Well-healed median  sternotomy. He has implantable pacemaker upper chest wall.  Pulmonary/Chest: Effort normal. No respiratory distress. He has no wheezes.  Abdominal: Soft. Bowel sounds are normal.  Musculoskeletal:  Patient has good range of motion of cervical spine without pain no significant brachial plexus tenderness negative Spurling. He has good biceps triceps. Reflexes are 1+ biceps triceps brachioradialis. 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 He has both hyperthenar and thenar atrophy both right and left  with weakness on testing interossei are weak. Fingers reach full extension. No wrist flexion extension weakness. Negative Tinel's over the cubital tunnel. Mild tenderness over the carpal canal right and left. Normal heel-to-toe gait. No decreased cessation lower extremities.  Neurological: He is alert and oriented to person, place, and time.  Skin: Skin is warm and dry. Capillary refill takes less than 2 seconds.  Psychiatric: He has a normal mood and affect. His behavior is normal. Judgment and thought content normal.    Ortho Exam no tenderness with percussion over the cubital tunnelulnar nerve subluxation. Triceps biceps are strong good wrist flexion and extension. Thenar hyperthenar atrophy is present.  Specialty Comments:  No specialty comments available.  Imaging: No results found.   PMFS History: Patient Active Problem List   Diagnosis Date Noted  . Bilateral hand numbness 07/15/2016  . Other spondylosis with radiculopathy, cervical region 07/15/2016  . CAD (coronary artery disease) 05/24/2010  . Atrioventricular block, complete ([place]) 05/24/2010  . Hypertension 05/24/2010   Past Medical History:  Diagnosis Date  . Anxiety   . Atrioventricular block, complete ([place]) 02/19/10   s/p PPM by JA  . CAD (coronary artery disease)    s/p CABG 02/15/2010 (single vessel)  . Hypertension   . Prostate cancer [facility])     Family History  Problem Relation Age of Onset  . Hypertension Unknown     Past Surgical History:  Procedure Laterality Date  . PACEMAKER INSERTION  02/19/10   by Greggory Brandy for complete heart block   Social History   Occupational History  . Not on file.   Social History Main Topics  . Smoking status: Never Smoker  . Smokeless tobacco: Never Used  . Alcohol use Yes  . Drug use: No  . Sexual activity: Not on file

## 2016-08-12 ENCOUNTER — Encounter (INDEPENDENT_AMBULATORY_CARE_PROVIDER_SITE_OTHER): Payer: Self-pay | Admitting: Physical Medicine and Rehabilitation

## 2016-08-12 ENCOUNTER — Ambulatory Visit (INDEPENDENT_AMBULATORY_CARE_PROVIDER_SITE_OTHER): Payer: Medicare Other | Admitting: Physical Medicine and Rehabilitation

## 2016-08-12 DIAGNOSIS — R202 Paresthesia of skin: Secondary | ICD-10-CM | POA: Diagnosis not present

## 2016-08-12 NOTE — Progress Notes (Deleted)
Right hand dominant. Numbness in first two fingers in right hand. Wakes up at night with entire right arm numb. Sometimes has symptoms on left.

## 2016-08-15 NOTE — Procedures (Signed)
EMG & NCV Findings: Evaluation of the left median motor nerve showed prolonged distal onset latency (6.6 ms), reduced amplitude (4.5 mV), and decreased conduction velocity (Elbow-Wrist, 47 m/s).  The right median motor and the right ulnar sensory nerves showed reduced amplitude (R0.1, R12.5 V).  The left median (across palm) sensory nerve showed prolonged distal peak latency (Wrist, 7.1 ms), reduced amplitude (7.7 V), and prolonged distal peak latency (Palm, 2.3 ms).  The right median (across palm) sensory nerve showed no response (Wrist) and no response (Palm).  All remaining nerves (as indicated in the following tables) were within normal limits.  Left vs. Right side comparison data for the median motor nerve indicates abnormal L-R latency difference (2.8 ms), abnormal L-R amplitude difference (97.8 %), and abnormal L-R velocity difference (Elbow-Wrist, 12 m/s).  The ulnar motor nerve indicates abnormal L-R latency difference (0.7 ms) and abnormal L-R velocity difference (A Elbow-B Elbow, 40 m/s).  All remaining left vs. right side differences were within normal limits.    Needle evaluation of the right abductor pollicis brevis muscle showed widespread spontaneous activity and diminished recruitment.  All remaining muscles (as indicated in the following table) showed no evidence of electrical instability.    Impression: The above electrodiagnostic study is ABNORMAL and reveals evidence of:  1.  A severe right median nerve entrapment at the wrist (carpal tunnel syndrome) affecting sensory and motor components. The lesion is characterized by sensory and motor demyelination with evidence of axonal injury.   2. A moderate left median nerve entrapment at the wrist (carpal tunnel syndrome) affecting sensory and motor components.   There was no significant electrodiagnostic evidence of other focal nerve entrapment, cervical radiculopathy, brachial plexopathy or peripheral  polyneuropathy.  Recommendations: 1.  Follow-up with referring physician. 2.  Continue current management of symptoms. 3.  Suggest surgical evaluation. Unfortunately, the level of severity on the left median nerve is such that even with appropriate decompression that may be residual symptoms.   Nerve Conduction Studies Anti Sensory Summary Table   Stim Site NR Peak (ms) Norm Peak (ms) P-T Amp (V) Norm P-T Amp Site1 Site2 Delta-P (ms) Dist (cm) Vel (m/s) Norm Vel (m/s)  Left Median Acr Palm Anti Sensory (2nd Digit)  Wrist    *7.1 <3.6 *7.7 >10 Wrist Palm 4.8 0.0    Palm    *2.3 <2.0 3.0         Right Median Acr Palm Anti Sensory (2nd Digit)  Wrist *NR  <3.6  >10 Wrist Palm  0.0    Palm *NR  <2.0          Left Radial Anti Sensory (Base 1st Digit)  Wrist    2.2 <3.1 13.6  Wrist Base 1st Digit 2.2 0.0    Right Radial Anti Sensory (Base 1st Digit)  Wrist    2.0 <3.1 13.7  Wrist Base 1st Digit 2.0 0.0    Left Ulnar Anti Sensory (5th Digit)  Wrist    3.4 <3.7 18.4 >15.0 Wrist 5th Digit 3.4 14.0 41 >38  Right Ulnar Anti Sensory (5th Digit)  Wrist    3.1 <3.7 *12.5 >15.0 Wrist 5th Digit 3.1 14.0 45 >38   Motor Summary Table   Stim Site NR Onset (ms) Norm Onset (ms) O-P Amp (mV) Norm O-P Amp Site1 Site2 Delta-0 (ms) Dist (cm) Vel (m/s) Norm Vel (m/s)  Left Median Motor (Abd Poll Brev)  Wrist    *6.6 <4.2 *4.5 >5 Elbow Wrist 5.7 27.0 *47 >50  Elbow  12.3  4.0         Right Median Motor (Abd Poll Brev)  Wrist    3.8 <4.2 *0.1 >5 Elbow Wrist 4.6 27.0 59 >50  Elbow    8.4  0.1         Left Ulnar Motor (Abd Dig Min)  Wrist    3.4 <4.2 9.1 >3 B Elbow Wrist 5.0 28.0 56 >53  B Elbow    8.4  7.6  A Elbow B Elbow 1.0 10.5 105 >53  A Elbow    9.4  7.2         Right Ulnar Motor (Abd Dig Min)  Wrist    2.7 <4.2 8.3 >3 B Elbow Wrist 4.8 28.0 58 >53  B Elbow    7.5  7.1  A Elbow B Elbow 1.7 11.0 65 >53  A Elbow    9.2  7.2          EMG   Side Muscle Nerve Root Ins Act Fibs Psw Amp Dur Poly  Recrt Int Fraser Din Comment  Right Abd Poll Brev Median C8-T1 Nml *4+ *4+ Nml Nml 0 *Reduced Nml   Right 1stDorInt Ulnar C8-T1 Nml Nml Nml Nml Nml 0 Nml Nml   Right PronatorTeres Median C6-7 Nml Nml Nml Nml Nml 0 Nml Nml   Right Biceps Musculocut C5-6 Nml Nml Nml Nml Nml 0 Nml Nml   Right Deltoid Axillary C5-6 Nml Nml Nml Nml Nml 0 Nml Nml     Nerve Conduction Studies Anti Sensory Left/Right Comparison   Stim Site L Lat (ms) R Lat (ms) L-R Lat (ms) L Amp (V) R Amp (V) L-R Amp (%) Site1 Site2 L Vel (m/s) R Vel (m/s) L-R Vel (m/s)  Median Acr Palm Anti Sensory (2nd Digit)  Wrist *7.1   *7.7   Wrist Palm     Palm *2.3   3.0         Radial Anti Sensory (Base 1st Digit)  Wrist 2.2 2.0 0.2 13.6 13.7 0.7 Wrist Base 1st Digit     Ulnar Anti Sensory (5th Digit)  Wrist 3.4 3.1 0.3 18.4 *12.5 32.1 Wrist 5th Digit 41 45 4   Motor Left/Right Comparison   Stim Site L Lat (ms) R Lat (ms) L-R Lat (ms) L Amp (mV) R Amp (mV) L-R Amp (%) Site1 Site2 L Vel (m/s) R Vel (m/s) L-R Vel (m/s)  Median Motor (Abd Poll Brev)  Wrist *6.6 3.8 *2.8 *4.5 *0.1 *97.8 Elbow Wrist *47 59 *12  Elbow 12.3 8.4 3.9 4.0 0.1 97.5       Ulnar Motor (Abd Dig Min)  Wrist 3.4 2.7 *0.7 9.1 8.3 8.8 B Elbow Wrist 56 58 2  B Elbow 8.4 7.5 0.9 7.6 7.1 6.6 A Elbow B Elbow 105 65 *40  A Elbow 9.4 9.2 0.2 7.2 7.2 0.0

## 2016-08-15 NOTE — Progress Notes (Signed)
James Harvey - 83 y.o. male MRN [id number]  Date of birth: 04-Oct-1932  Office Visit Note: Visit Date: 08/12/2016 PCP: Tamsen Roers, MD Referred by: Tamsen Roers, MD  Subjective: Chief Complaint  Patient presents with  . Right Hand - Numbness  . Left Hand - Numbness   HPI: [name] is an 83 year old right-hand dominant gentleman who continues to stay very active and does a lot of work around [place]. He reports approximately 1 year of worsening right hand pain and numbness and weakness particularly in the first 2 digits on the right hand. He sometimes gets symptoms on the left as well but not as bad. He states that the numbness will wake him up at night in his entire right arm feels like it's numb. He reports this is a lot more constant than it used to be and he reports lot more weakness. He is dropping objects and has difficulty buttoning clothes. Per his note he hasn't been evaluated before for his hands. He has not had prior carpal tunnel surgery. He may have had electrodiagnostic studies but is unsure of the results. He saw Dr. Lorin Mercy approximately a year ago and then most recently. During that interim he had some difficulties from a cardiac standpoint did have a CABG as well as a pacemaker placement. He does report some neck pain but not frank radicular symptoms. He does not report specific injury. 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    ROS Otherwise per HPI.  Assessment & Plan: Visit Diagnoses:  1. Paresthesia of skin     Plan: No additional findings.  Impression: The above electrodiagnostic study is ABNORMAL and reveals evidence of:  1.  A severe right median nerve entrapment at the wrist (carpal tunnel syndrome) affecting sensory and motor components. The lesion is characterized by sensory and motor demyelination with evidence of axonal injury.   2. A moderate left median nerve  entrapment at the wrist (carpal tunnel syndrome) affecting sensory and motor components.   There was no significant electrodiagnostic evidence of other focal nerve entrapment, cervical radiculopathy, brachial plexopathy or peripheral polyneuropathy.  Recommendations: 1.  Follow-up with referring physician. 2.  Continue current management of symptoms. 3.  Suggest surgical evaluation. Unfortunately, the level of severity on the left median nerve is such that even with appropriate decompression that may be residual symptoms.   Meds & Orders: No orders of the defined types were placed in this encounter.   Orders Placed This Encounter  Procedures  . NCV with EMG (electromyography)    Follow-up: Return in about 2 weeks (around 08/26/2016) for Dr. Lorin Mercy.   Procedures: No procedures performed  EMG & NCV Findings: Evaluation of the left median motor nerve showed prolonged distal onset latency (6.6 ms), reduced amplitude (4.5 mV), and decreased conduction velocity (Elbow-Wrist, 47 m/s).  The right median motor and the right ulnar sensory nerves showed reduced amplitude (R0.1, R12.5 V).  The left median (across palm) sensory nerve showed prolonged distal peak latency (Wrist, 7.1 ms), reduced amplitude (7.7 V), and prolonged distal peak latency (Palm, 2.3 ms).  The right median (across palm) sensory nerve showed no response (Wrist) and no response (Palm).  All remaining nerves (as indicated in the following tables) were within normal limits.  Left vs. Right side comparison data for the median motor nerve indicates abnormal L-R latency difference (2.8 ms), abnormal L-R amplitude difference (97.8 %),  and abnormal L-R velocity difference (Elbow-Wrist, 12 m/s).  The ulnar motor nerve indicates abnormal L-R latency difference (0.7 ms) and abnormal L-R velocity difference (A Elbow-B Elbow, 40 m/s).  All remaining left vs. right side differences were within normal limits.    Needle evaluation of the right  abductor pollicis brevis muscle showed widespread spontaneous activity and diminished recruitment.  All remaining muscles (as indicated in the following table) showed no evidence of electrical instability.    Impression: The above electrodiagnostic study is ABNORMAL and reveals evidence of:  1.  A severe right median nerve entrapment at the wrist (carpal tunnel syndrome) affecting sensory and motor components. The lesion is characterized by sensory and motor demyelination with evidence of axonal injury.   2. A moderate left median nerve entrapment at the wrist (carpal tunnel syndrome) affecting sensory and motor components.   There was no significant electrodiagnostic evidence of other focal nerve entrapment, cervical radiculopathy, brachial plexopathy or peripheral polyneuropathy.  Recommendations: 1.  Follow-up with referring physician. 2.  Continue current management of symptoms. 3.  Suggest surgical evaluation. Unfortunately, the level of severity on the left median nerve is such that even with appropriate decompression that may be residual symptoms.   Nerve Conduction Studies Anti Sensory Summary Table   Stim Site NR Peak (ms) Norm Peak (ms) P-T Amp (V) Norm P-T Amp Site1 Site2 Delta-P (ms) Dist (cm) Vel (m/s) Norm Vel (m/s)  Left Median Acr Palm Anti Sensory (2nd Digit)  Wrist    *7.1 <3.6 *7.7 >10 Wrist Palm 4.8 0.0    Palm    *2.3 <2.0 3.0         Right Median Acr Palm Anti Sensory (2nd Digit)  Wrist *NR  <3.6  >10 Wrist Palm  0.0    Palm *NR  <2.0          Left Radial Anti Sensory (Base 1st Digit)  Wrist    2.2 <3.1 13.6  Wrist Base 1st Digit 2.2 0.0    Right Radial Anti Sensory (Base 1st Digit)  Wrist    2.0 <3.1 13.7  Wrist Base 1st Digit 2.0 0.0    Left Ulnar Anti Sensory (5th Digit)  Wrist    3.4 <3.7 18.4 >15.0 Wrist 5th Digit 3.4 14.0 41 >38  Right Ulnar Anti Sensory (5th Digit)  Wrist    3.1 <3.7 *12.5 >15.0 Wrist 5th Digit 3.1 14.0 45 >38   Motor Summary  Table   Stim Site NR Onset (ms) Norm Onset (ms) O-P Amp (mV) Norm O-P Amp Site1 Site2 Delta-0 (ms) Dist (cm) Vel (m/s) Norm Vel (m/s)  Left Median Motor (Abd Poll Brev)  Wrist    *6.6 <4.2 *4.5 >5 Elbow Wrist 5.7 27.0 *47 >50  Elbow    12.3  4.0         Right Median Motor (Abd Poll Brev)  Wrist    3.8 <4.2 *0.1 >5 Elbow Wrist 4.6 27.0 59 >50  Elbow    8.4  0.1         Left Ulnar Motor (Abd Dig Min)  Wrist    3.4 <4.2 9.1 >3 B Elbow Wrist 5.0 28.0 56 >53  B Elbow    8.4  7.6  A Elbow B Elbow 1.0 10.5 105 >53  A Elbow    9.4  7.2         Right Ulnar Motor (Abd Dig Min)  Wrist    2.7 <4.2 8.3 >3 B Elbow Wrist 4.8 28.0 58 >  53  B Elbow    7.5  7.1  A Elbow B Elbow 1.7 11.0 65 >53  A Elbow    9.2  7.2          EMG   Side Muscle Nerve Root Ins Act Fibs Psw Amp Dur Poly Recrt Int Fraser Din Comment  Right Abd Poll Brev Median C8-T1 Nml *4+ *4+ Nml Nml 0 *Reduced Nml   Right 1stDorInt Ulnar C8-T1 Nml Nml Nml Nml Nml 0 Nml Nml   Right PronatorTeres Median C6-7 Nml Nml Nml Nml Nml 0 Nml Nml   Right Biceps Musculocut C5-6 Nml Nml Nml Nml Nml 0 Nml Nml   Right Deltoid Axillary C5-6 Nml Nml Nml Nml Nml 0 Nml Nml     Nerve Conduction Studies Anti Sensory Left/Right Comparison   Stim Site L Lat (ms) R Lat (ms) L-R Lat (ms) L Amp (V) R Amp (V) L-R Amp (%) Site1 Site2 L Vel (m/s) R Vel (m/s) L-R Vel (m/s)  Median Acr Palm Anti Sensory (2nd Digit)  Wrist *7.1   *7.7   Wrist Palm     Palm *2.3   3.0         Radial Anti Sensory (Base 1st Digit)  Wrist 2.2 2.0 0.2 13.6 13.7 0.7 Wrist Base 1st Digit     Ulnar Anti Sensory (5th Digit)  Wrist 3.4 3.1 0.3 18.4 *12.5 32.1 Wrist 5th Digit 41 45 4   Motor Left/Right Comparison   Stim Site L Lat (ms) R Lat (ms) L-R Lat (ms) L Amp (mV) R Amp (mV) L-R Amp (%) Site1 Site2 L Vel (m/s) R Vel (m/s) L-R Vel (m/s)  Median Motor (Abd Poll Brev)  Wrist *6.6 3.8 *2.8 *4.5 *0.1 *97.8 Elbow Wrist *47 59 *12  Elbow 12.3 8.4 3.9 4.0 0.1 97.5       Ulnar Motor (Abd Dig  Min)  Wrist 3.4 2.7 *0.7 9.1 8.3 8.8 B Elbow Wrist 56 58 2  B Elbow 8.4 7.5 0.9 7.6 7.1 6.6 A Elbow B Elbow 105 65 *40  A Elbow 9.4 9.2 0.2 7.2 7.2 0.0             Clinical History: No specialty comments available.  He reports that he has never smoked. He has never used smokeless tobacco. No results for input(s): HGBA1C, LABURIC in the last 8760 hours.  Objective:  VS:  HT:    WT:   BMI:     BP:   HR: bpm  TEMP: ( )  RESP:  Physical Exam  Musculoskeletal:  Inspection reveals osteoarthritic changes in both hands as well as significant atrophy of the right APB but no atrophy of the left APB or bilateral FDI or hand intrinsics. There is no swelling, color changes, allodynia or dystrophic changes. There is 5 out of 5 strength in the bilateral wrist extension, finger abduction and long finger flexion. There is weakness of right thumb abduction. There is decreased sensation to light touch in the right median nerve distribution. There is a negative Hoffmann's test bilaterally.    Ortho Exam Imaging: No results found.  Past Medical/Family/Surgical/Social History: Medications & Allergies reviewed per EMR Patient Active Problem List   Diagnosis Date Noted  . Bilateral hand numbness 07/15/2016  . Other spondylosis with radiculopathy, cervical region 07/15/2016  . CAD (coronary artery disease) 05/24/2010  . Atrioventricular block, complete ([place]) 05/24/2010  . Hypertension 05/24/2010   Past Medical History:  Diagnosis Date  . Anxiety   . Atrioventricular block,  complete ([place]) 02/19/10   s/p PPM by JA  . CAD (coronary artery disease)    s/p CABG 02/15/2010 (single vessel)  . Hypertension   . Prostate cancer [facility])    Family History  Problem Relation Age of Onset  . Hypertension Unknown    Past Surgical History:  Procedure Laterality Date  . PACEMAKER INSERTION  02/19/10   by Greggory Brandy for complete heart block   Social History   Occupational History  . Not on file.   Social  History Main Topics  . Smoking status: Never Smoker  . Smokeless tobacco: Never Used  . Alcohol use Yes  . Drug use: No  . Sexual activity: Not on file

## 2016-09-10 ENCOUNTER — Encounter (INDEPENDENT_AMBULATORY_CARE_PROVIDER_SITE_OTHER): Payer: Self-pay | Admitting: Orthopaedic Surgery

## 2016-09-10 ENCOUNTER — Ambulatory Visit (INDEPENDENT_AMBULATORY_CARE_PROVIDER_SITE_OTHER): Payer: Medicare Other | Admitting: Orthopaedic Surgery

## 2016-09-10 VITALS — BP 135/68 | HR 55 | Ht 70.0 in | Wt 209.0 lb

## 2016-09-10 DIAGNOSIS — G5601 Carpal tunnel syndrome, right upper limb: Secondary | ICD-10-CM

## 2016-09-10 DIAGNOSIS — G5602 Carpal tunnel syndrome, left upper limb: Secondary | ICD-10-CM | POA: Diagnosis not present

## 2016-09-10 NOTE — Progress Notes (Signed)
Office Visit Note   Patient: James Harvey           Date of Birth: [birth year]           MRN: [id number] Visit Date: 09/10/2016              Requested by: Tamsen Roers, [place], [place] [id number] PCP: Tamsen Roers, MD   Assessment & Plan: Visit Diagnoses:  1. Carpal tunnel syndrome, left upper limb   2. Carpal tunnel syndrome, right upper limb     Plan: Proceed with right carpal tunnel release.He had seen Dr. Fredna Dow in March and has been followed in our practice since 2009. I have recommended to him since his symptoms of progressive carpal tunnel release is indicated. Patient states that he would like to stay in our practice decide my encouragement to return to Dr. Fredna Dow. Will need to get preoperative clearance by Dr. Einar Gip his cardiologist. Had pacemaker history with CABG in the past. Surgery would be with some IV sedation and local anesthesia an outpatient procedure.  Follow-Up Instructions: No Follow-up on file.   Orders:  No orders of the defined types were placed in this encounter.  No orders of the defined types were placed in this encounter.     Procedures: No procedures performed   Clinical Data: No additional findings.   Subjective: Chief Complaint  Patient presents with  . Neck - Pain, Follow-up    HPI history 83-year-old with persistent bilateral hand numbness with right thenar atrophy. 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 Hands bother him at night he wakes up and shake his hands. EMGs and nerve conduction velocity showed severe right carpal tunnel syndrome and moderate left carpal tunnel syndrome. He has more significant atrophy on the right than left. Ulnar nerve tests were normal. He been seen by me a year and half ago and at that time his symptoms were not severe enough to consider surgery but now they've progressed he's having more symptoms with dropping objects and more numbness in wakes him up at night.  Review of Systems  14 point review of systems is updated and is unchanged from 07/15/2016. Of notice is pacemaker/defibrillator and also he was CABG procedure. Dr. Einar Gip is his cardiologist.   Objective: Vital Signs: BP 135/68   Pulse (!) 55   Ht 5\' 10"  (1.778 m)   Wt 209 lb (94.8 kg)   BMI 29.99 kg/m   Physical Exam  Constitutional: He is oriented to person, place, and time. He appears well-developed and well-nourished.  HENT:  Head: Normocephalic and atraumatic.  Eyes: Pupils are equal, round, and reactive to light. EOM are normal.  Neck: No tracheal deviation present. No thyromegaly present.  Cardiovascular: Normal rate.   Palpable pacemaker, healed median sternotomy.  Pulmonary/Chest: Effort normal. He has no wheezes.  Abdominal: Soft. Bowel sounds are normal.  Neurological: He is alert and oriented to person, place, and time.  Skin: Skin is warm and dry. Capillary refill takes less than 2 seconds.  Psychiatric: He has a normal mood and affect. His behavior is normal. Judgment and thought content normal.    Ortho Exam good cervical range of motion. Negative Spurling. Reflexes right and left upper extremities are 1+ and symmetrical. 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 Significant thenar atrophy on the right mild on the left. Positive carpal compression test. Negative Tinel's over the cubital tunnel ulnar nerve subluxation.  Specialty Comments:  No  specialty comments available.  Imaging: No results found.   PMFS History: Patient Active Problem List   Diagnosis Date Noted  . Bilateral hand numbness 07/15/2016  . Other spondylosis with radiculopathy, cervical region 07/15/2016  . CAD (coronary artery disease) 05/24/2010  . Atrioventricular block, complete ([place]) 05/24/2010  . Hypertension 05/24/2010   Past Medical History:  Diagnosis Date  . Anxiety   . Atrioventricular block, complete ([place]) 02/19/10   s/p PPM by JA  . CAD (coronary artery disease)    s/p CABG 02/15/2010  (single vessel)  . Hypertension   . Prostate cancer [facility])     Family History  Problem Relation Age of Onset  . Hypertension Unknown     Past Surgical History:  Procedure Laterality Date  . PACEMAKER INSERTION  02/19/10   by Greggory Brandy for complete heart block   Social History   Occupational History  . Not on file.   Social History Main Topics  . Smoking status: Never Smoker  . Smokeless tobacco: Never Used  . Alcohol use Yes  . Drug use: No  . Sexual activity: Not on file

## 2016-09-25 ENCOUNTER — Telehealth (INDEPENDENT_AMBULATORY_CARE_PROVIDER_SITE_OTHER): Payer: Self-pay | Admitting: Orthopaedic Surgery

## 2016-09-25 NOTE — Telephone Encounter (Signed)
Per Katharine Look, disregard message. Patient spoke with someone else.

## 2016-09-29 DIAGNOSIS — G5601 Carpal tunnel syndrome, right upper limb: Secondary | ICD-10-CM | POA: Diagnosis not present

## 2016-10-07 ENCOUNTER — Encounter (INDEPENDENT_AMBULATORY_CARE_PROVIDER_SITE_OTHER): Payer: Self-pay | Admitting: Orthopaedic Surgery

## 2016-10-07 ENCOUNTER — Ambulatory Visit (INDEPENDENT_AMBULATORY_CARE_PROVIDER_SITE_OTHER): Payer: Medicare Other | Admitting: Orthopaedic Surgery

## 2016-10-07 VITALS — BP 144/74 | HR 63 | Ht 70.0 in | Wt 210.0 lb

## 2016-10-07 DIAGNOSIS — R200 Anesthesia of skin: Secondary | ICD-10-CM

## 2016-10-07 NOTE — Progress Notes (Signed)
   Post-Op Visit Note   Patient: James Harvey           Date of Birth: Apr 08, 1932           MRN: [id number] Visit Date: 10/07/2016 PCP: Tamsen Roers, MD   Assessment & Plan: Postop right carpal tunnel release. Incision looks good wrist splint applied. He can remove it for washing his hand or bathing. Return one week for suture removal.  Chief Complaint:  Chief Complaint  Patient presents with  . Right Wrist - Routine Post Op   Visit Diagnoses: Status post right carpal tunnel release.  Plan: A stockinette and wrist splint applied he can remove for bathing return one week for suture removal  Follow-Up Instructions: Return in about 1 week (around 10/14/2016).   Orders:  No orders of the defined types were placed in this encounter.  No orders of the defined types were placed in this encounter.   Imaging: No results found.  PMFS History: Patient Active Problem List   Diagnosis Date Noted  . Bilateral hand numbness 07/15/2016  . Other spondylosis with radiculopathy, cervical region 07/15/2016  . CAD (coronary artery disease) 05/24/2010  . Atrioventricular block, complete ([place]) 05/24/2010  . Hypertension 05/24/2010   Past Medical History:  Diagnosis Date  . Anxiety   . Atrioventricular block, complete ([place]) 02/19/10   s/p PPM by JA  . CAD (coronary artery disease)    s/p CABG 02/15/2010 (single vessel)  . Hypertension   . Prostate cancer [facility])     Family History  Problem Relation Age of Onset  . Hypertension Unknown     Past Surgical History:  Procedure Laterality Date  . PACEMAKER INSERTION  02/19/10   by Greggory Brandy for complete heart block   Social History   Occupational History  . Not on file.   Social History Main Topics  . Smoking status: Never Smoker  . Smokeless tobacco: Never Used  . Alcohol use Yes  . Drug use: No  . Sexual activity: Not on file

## 2016-10-14 ENCOUNTER — Encounter (INDEPENDENT_AMBULATORY_CARE_PROVIDER_SITE_OTHER): Payer: Self-pay | Admitting: Orthopaedic Surgery

## 2016-10-14 ENCOUNTER — Ambulatory Visit (INDEPENDENT_AMBULATORY_CARE_PROVIDER_SITE_OTHER): Payer: Medicare Other | Admitting: Orthopaedic Surgery

## 2016-10-14 DIAGNOSIS — R200 Anesthesia of skin: Secondary | ICD-10-CM

## 2016-10-14 NOTE — Progress Notes (Signed)
   Post-Op Visit Note   Patient: James Harvey           Date of Birth: 11/29/1932           MRN: [id number] Visit Date: 10/14/2016 PCP: Tamsen Roers, MD   Assessment & Plan: Post carpal tunnel right hand. 2 sutures of pulled loose. Slight separation. Tincture benzoin Steri-Strips applied. Will recheck in one month and we can discuss his opposite left hand.  Chief Complaint:  Chief Complaint  Patient presents with  . Right Hand - Routine Post Op   Visit Diagnoses:  1. Bilateral hand numbness     Plan: Sutures removed recheck 1 month to discuss his opposite left hand which also has carpal tunnel syndrome.  Follow-Up Instructions: No Follow-up on file.   Orders:  No orders of the defined types were placed in this encounter.  No orders of the defined types were placed in this encounter.   Imaging: No results found.  PMFS History: Patient Active Problem List   Diagnosis Date Noted  . Bilateral hand numbness 07/15/2016  . Other spondylosis with radiculopathy, cervical region 07/15/2016  . CAD (coronary artery disease) 05/24/2010  . Atrioventricular block, complete ([place]) 05/24/2010  . Hypertension 05/24/2010   Past Medical History:  Diagnosis Date  . Anxiety   . Atrioventricular block, complete ([place]) 02/19/10   s/p PPM by JA  . CAD (coronary artery disease)    s/p CABG 02/15/2010 (single vessel)  . Hypertension   . Prostate cancer [facility])     Family History  Problem Relation Age of Onset  . Hypertension Unknown     Past Surgical History:  Procedure Laterality Date  . PACEMAKER INSERTION  02/19/10   by Greggory Brandy for complete heart block   Social History   Occupational History  . Not on file.   Social History Main Topics  . Smoking status: Never Smoker  . Smokeless tobacco: Never Used  . Alcohol use Yes  . Drug use: No  . Sexual activity: Not on file

## 2016-11-18 ENCOUNTER — Encounter (INDEPENDENT_AMBULATORY_CARE_PROVIDER_SITE_OTHER): Payer: Self-pay | Admitting: Orthopaedic Surgery

## 2016-11-18 ENCOUNTER — Ambulatory Visit (INDEPENDENT_AMBULATORY_CARE_PROVIDER_SITE_OTHER): Payer: Medicare Other | Admitting: Orthopaedic Surgery

## 2016-11-18 VITALS — BP 125/63 | HR 58

## 2016-11-18 DIAGNOSIS — R200 Anesthesia of skin: Secondary | ICD-10-CM

## 2016-11-18 NOTE — Progress Notes (Signed)
   Post-Op Visit Note   Patient: James Harvey           Date of Birth: September 16, 1932           MRN: [id number] Visit Date: 11/18/2016 PCP: Tamsen Roers, MD   Assessment & Plan: Post right carpal tunnel release 09/29/2016. He's just came upon reading pick up some objects he still has numbness and still has pain bothers him at night he can use his splint. He had severe carpal tunnel and has significant thenar atrophy I discussed with them that in severe cases that it sometimes takes longer for improvement and sometimes he still will have some degree of pain and numbness due to the severity of the compression. His electrical studies showed severe changes. Opposite left hand is not as bad as it is a right hand doing better he can call about scheduling the left carpal tunnel release. I plan a recheck him in 2 months.  Chief Complaint:  Chief Complaint  Patient presents with  . Right Wrist - Routine Post Op   Visit Diagnoses:  1. Bilateral hand numbness     Plan: Return 2 months.  Follow-Up Instructions: Return in about 2 months (around 01/18/2017).   Orders:  No orders of the defined types were placed in this encounter.  No orders of the defined types were placed in this encounter.   Imaging: No results found.  PMFS History: Patient Active Problem List   Diagnosis Date Noted  . Bilateral hand numbness 07/15/2016  . Other spondylosis with radiculopathy, cervical region 07/15/2016  . CAD (coronary artery disease) 05/24/2010  . Atrioventricular block, complete ([place]) 05/24/2010  . Hypertension 05/24/2010   Past Medical History:  Diagnosis Date  . Anxiety   . Atrioventricular block, complete ([place]) 02/19/10   s/p PPM by JA  . CAD (coronary artery disease)    s/p CABG 02/15/2010 (single vessel)  . Hypertension   . Prostate cancer [facility])     Family History  Problem Relation Age of Onset  . Hypertension Unknown     Past Surgical History:  Procedure Laterality Date  . PACEMAKER  INSERTION  02/19/10   by Greggory Brandy for complete heart block   Social History   Occupational History  . Not on file.   Social History Main Topics  . Smoking status: Never Smoker  . Smokeless tobacco: Never Used  . Alcohol use Yes  . Drug use: No  . Sexual activity: Not on file

## 2016-11-19 ENCOUNTER — Ambulatory Visit
Admission: RE | Admit: 2016-11-19 | Discharge: 2016-11-19 | Disposition: A | Discharge status: Home / Self Care | Payer: Medicare Other | Source: Ambulatory Visit | Attending: Family Medicine | Admitting: Family Medicine

## 2016-11-19 ENCOUNTER — Other Ambulatory Visit: Payer: Self-pay | Admitting: Family Medicine

## 2016-11-19 DIAGNOSIS — R0989 Other specified symptoms and signs involving the circulatory and respiratory systems: Secondary | ICD-10-CM

## 2017-01-05 ENCOUNTER — Emergency Department (HOSPITAL_COMMUNITY): Payer: Medicare Other

## 2017-01-05 ENCOUNTER — Observation Stay (HOSPITAL_COMMUNITY)
Admission: EM | Admit: 2017-01-05 | Discharge: 2017-01-06 | Disposition: A | Discharge status: Home / Self Care | Payer: Medicare Other | Attending: Emergency Medicine | Admitting: Emergency Medicine

## 2017-01-05 ENCOUNTER — Encounter (HOSPITAL_COMMUNITY): Payer: Self-pay | Admitting: Radiology

## 2017-01-05 DIAGNOSIS — S12101A Unspecified nondisplaced fracture of second cervical vertebra, initial encounter for closed fracture: Secondary | ICD-10-CM

## 2017-01-05 DIAGNOSIS — F419 Anxiety disorder, unspecified: Secondary | ICD-10-CM | POA: Diagnosis not present

## 2017-01-05 DIAGNOSIS — Z950 Presence of cardiac pacemaker: Secondary | ICD-10-CM | POA: Insufficient documentation

## 2017-01-05 DIAGNOSIS — I2510 Atherosclerotic heart disease of native coronary artery without angina pectoris: Secondary | ICD-10-CM | POA: Insufficient documentation

## 2017-01-05 DIAGNOSIS — Z7901 Long term (current) use of anticoagulants: Secondary | ICD-10-CM | POA: Diagnosis not present

## 2017-01-05 DIAGNOSIS — S1093XA Contusion of unspecified part of neck, initial encounter: Secondary | ICD-10-CM

## 2017-01-05 DIAGNOSIS — S199XXA Unspecified injury of neck, initial encounter: Secondary | ICD-10-CM | POA: Diagnosis present

## 2017-01-05 DIAGNOSIS — S12601A Unspecified nondisplaced fracture of seventh cervical vertebra, initial encounter for closed fracture: Secondary | ICD-10-CM | POA: Diagnosis not present

## 2017-01-05 DIAGNOSIS — I10 Essential (primary) hypertension: Secondary | ICD-10-CM | POA: Insufficient documentation

## 2017-01-05 DIAGNOSIS — S12691A Other nondisplaced fracture of seventh cervical vertebra, initial encounter for closed fracture: Principal | ICD-10-CM

## 2017-01-05 DIAGNOSIS — S12100A Unspecified displaced fracture of second cervical vertebra, initial encounter for closed fracture: Secondary | ICD-10-CM | POA: Diagnosis present

## 2017-01-05 DIAGNOSIS — S0083XA Contusion of other part of head, initial encounter: Secondary | ICD-10-CM | POA: Diagnosis not present

## 2017-01-05 DIAGNOSIS — Z951 Presence of aortocoronary bypass graft: Secondary | ICD-10-CM | POA: Diagnosis not present

## 2017-01-05 DIAGNOSIS — Z79899 Other long term (current) drug therapy: Secondary | ICD-10-CM | POA: Insufficient documentation

## 2017-01-05 DIAGNOSIS — Z8546 Personal history of malignant neoplasm of prostate: Secondary | ICD-10-CM | POA: Diagnosis not present

## 2017-01-05 LAB — COMPREHENSIVE METABOLIC PANEL
ALBUMIN: 4 g/dL (ref 3.5–5.0)
ALK PHOS: 69 U/L (ref 38–126)
ALT: 30 U/L (ref 17–63)
AST: 38 U/L (ref 15–41)
Anion gap: 13 (ref 5–15)
BUN: 27 mg/dL — ABNORMAL HIGH (ref 6–20)
CALCIUM: 9.1 mg/dL (ref 8.9–10.3)
CO2: 18 mmol/L — AB (ref 22–32)
CREATININE: 1.54 mg/dL — AB (ref 0.61–1.24)
Chloride: 106 mmol/L (ref 101–111)
GFR calc Af Amer: 47 mL/min — ABNORMAL LOW (ref >60)
GFR calc non Af Amer: 40 mL/min — ABNORMAL LOW (ref >60)
GLUCOSE: 125 mg/dL — AB (ref 65–99)
Potassium: 4.5 mmol/L (ref 3.5–5.1)
SODIUM: 137 mmol/L (ref 135–145)
Total Bilirubin: 1 mg/dL (ref 0.3–1.2)
Total Protein: 8 g/dL (ref 6.5–8.1)

## 2017-01-05 LAB — I-STAT CHEM 8, ED
BUN: 36 mg/dL — AB (ref 6–20)
CHLORIDE: 108 mmol/L (ref 101–111)
Calcium, Ion: 1.13 mmol/L — ABNORMAL LOW (ref 1.15–1.40)
Creatinine, Ser: 1.4 mg/dL — ABNORMAL HIGH (ref 0.61–1.24)
GLUCOSE: 129 mg/dL — AB (ref 65–99)
HCT: 49 % (ref 39.0–52.0)
Hemoglobin: 16.7 g/dL (ref 13.0–17.0)
POTASSIUM: 4.8 mmol/L (ref 3.5–5.1)
SODIUM: 140 mmol/L (ref 135–145)
TCO2: 26 mmol/L (ref 22–32)

## 2017-01-05 LAB — CBC
HCT: 47.3 % (ref 39.0–52.0)
Hemoglobin: 15.3 g/dL (ref 13.0–17.0)
MCH: 31.3 pg (ref 26.0–34.0)
MCHC: 32.3 g/dL (ref 30.0–36.0)
MCV: 96.7 fL (ref 78.0–100.0)
PLATELETS: 140 10*3/uL — AB (ref 150–400)
RBC: 4.89 MIL/uL (ref 4.22–5.81)
RDW: 14.9 % (ref 11.5–15.5)
WBC: 13.3 10*3/uL — ABNORMAL HIGH (ref 4.0–10.5)

## 2017-01-05 LAB — URINALYSIS, ROUTINE W REFLEX MICROSCOPIC
BILIRUBIN URINE: NEGATIVE
Bacteria, UA: NONE SEEN
GLUCOSE, UA: NEGATIVE mg/dL
Ketones, ur: NEGATIVE mg/dL
Nitrite: NEGATIVE
PH: 6 (ref 5.0–8.0)
Protein, ur: 30 mg/dL — AB
Specific Gravity, Urine: 1.026 (ref 1.005–1.030)

## 2017-01-05 MED ORDER — IOPAMIDOL (ISOVUE-370) INJECTION 76%
INTRAVENOUS | Status: AC
  Administered 2017-01-05: 50 mL
  Filled 2017-01-05: qty 50

## 2017-01-05 MED ORDER — SODIUM CHLORIDE 0.9 % IV BOLUS (SEPSIS)
500.0000 mL | Freq: Once | INTRAVENOUS | Status: AC
  Administered 2017-01-05: 500 mL via INTRAVENOUS

## 2017-01-05 MED ORDER — IOPAMIDOL (ISOVUE-300) INJECTION 61%
INTRAVENOUS | Status: AC
  Administered 2017-01-05: 100 mL
  Filled 2017-01-05: qty 100

## 2017-01-05 NOTE — ED Notes (Signed)
Writer notified EDP of abnormal I-stat lactic result 

## 2017-01-05 NOTE — ED Notes (Signed)
Pt returned from CT.  Friend at bedside

## 2017-01-05 NOTE — ED Notes (Addendum)
Assumed care on pt. , pt. currently at CT scan.
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
## 2017-01-05 NOTE — ED Notes (Signed)
Pt remains alert and oriented.  Pt returns to CT

## 2017-01-05 NOTE — ED Notes (Signed)
$  21,892.00  Cash, 1 set keys, 1 green wallet, 1 grey wallet, 1 red pocket knife.  All locked up;in security.

## 2017-01-06 ENCOUNTER — Other Ambulatory Visit: Payer: Self-pay

## 2017-01-06 ENCOUNTER — Encounter (HOSPITAL_COMMUNITY): Payer: Self-pay | Admitting: Emergency Medicine

## 2017-01-06 DIAGNOSIS — S12691A Other nondisplaced fracture of seventh cervical vertebra, initial encounter for closed fracture: Secondary | ICD-10-CM | POA: Diagnosis not present

## 2017-01-06 DIAGNOSIS — S12100A Unspecified displaced fracture of second cervical vertebra, initial encounter for closed fracture: Secondary | ICD-10-CM | POA: Diagnosis present

## 2017-01-06 LAB — BASIC METABOLIC PANEL
Anion gap: 8 (ref 5–15)
BUN: 22 mg/dL — ABNORMAL HIGH (ref 6–20)
CALCIUM: 9 mg/dL (ref 8.9–10.3)
CHLORIDE: 106 mmol/L (ref 101–111)
CO2: 25 mmol/L (ref 22–32)
CREATININE: 1.43 mg/dL — AB (ref 0.61–1.24)
GFR calc non Af Amer: 44 mL/min — ABNORMAL LOW (ref >60)
GFR, EST AFRICAN AMERICAN: 51 mL/min — AB (ref >60)
GLUCOSE: 125 mg/dL — AB (ref 65–99)
Potassium: 4.3 mmol/L (ref 3.5–5.1)
Sodium: 139 mmol/L (ref 135–145)

## 2017-01-06 LAB — CBC
HEMATOCRIT: 43 % (ref 39.0–52.0)
HEMOGLOBIN: 13.8 g/dL (ref 13.0–17.0)
MCH: 30.9 pg (ref 26.0–34.0)
MCHC: 32.1 g/dL (ref 30.0–36.0)
MCV: 96.2 fL (ref 78.0–100.0)
Platelets: 148 10*3/uL — ABNORMAL LOW (ref 150–400)
RBC: 4.47 MIL/uL (ref 4.22–5.81)
RDW: 15.1 % (ref 11.5–15.5)
WBC: 8.7 10*3/uL (ref 4.0–10.5)

## 2017-01-06 LAB — PROTIME-INR
INR: 1.26
Prothrombin Time: 15.7 seconds — ABNORMAL HIGH (ref 11.4–15.2)

## 2017-01-06 LAB — CG4 I-STAT (LACTIC ACID): Lactic Acid, Venous: 2.46 mmol/L (ref 0.5–1.9)

## 2017-01-06 MED ORDER — ACETAMINOPHEN 325 MG PO TABS: 650.0000 mg | ORAL_TABLET | ORAL | Status: DC | PRN

## 2017-01-06 MED ORDER — METOPROLOL TARTRATE 5 MG/5ML IV SOLN: 5.0000 mg | Freq: Four times a day (QID) | INTRAVENOUS | Status: DC | PRN

## 2017-01-06 MED ORDER — SODIUM CHLORIDE 0.9% FLUSH: 3.0000 mL | INTRAVENOUS | Status: DC | PRN

## 2017-01-06 MED ORDER — OXYCODONE HCL 5 MG PO TABS
5.0000 mg | ORAL_TABLET | ORAL | Status: DC | PRN
  Administered 2017-01-06: 5 mg via ORAL
  Filled 2017-01-06: qty 1

## 2017-01-06 MED ORDER — PANTOPRAZOLE SODIUM 40 MG PO TBEC
40.0000 mg | DELAYED_RELEASE_TABLET | Freq: Every day | ORAL | Status: DC
  Administered 2017-01-06: 40 mg via ORAL
  Filled 2017-01-06: qty 1

## 2017-01-06 MED ORDER — DOCUSATE SODIUM 100 MG PO CAPS
100.0000 mg | ORAL_CAPSULE | Freq: Two times a day (BID) | ORAL | Status: DC
  Administered 2017-01-06: 100 mg via ORAL
  Filled 2017-01-06: qty 1

## 2017-01-06 MED ORDER — APIXABAN 5 MG PO TABS: 5.0000 mg | ORAL_TABLET | Freq: Two times a day (BID) | ORAL | Status: DC

## 2017-01-06 MED ORDER — HYDROMORPHONE HCL 1 MG/ML IJ SOLN: 0.5000 mg | INTRAMUSCULAR | Status: DC | PRN

## 2017-01-06 MED ORDER — SODIUM CHLORIDE 0.9 % IV SOLN: 250.0000 mL | INTRAVENOUS | Status: DC | PRN

## 2017-01-06 MED ORDER — METOPROLOL SUCCINATE ER 50 MG PO TB24
50.0000 mg | ORAL_TABLET | Freq: Two times a day (BID) | ORAL | Status: DC
  Administered 2017-01-06 (×2): 50 mg via ORAL
  Filled 2017-01-06 (×2): qty 1

## 2017-01-06 MED ORDER — LOSARTAN POTASSIUM 50 MG PO TABS
50.0000 mg | ORAL_TABLET | Freq: Every day | ORAL | Status: DC
  Administered 2017-01-06: 50 mg via ORAL
  Filled 2017-01-06: qty 1

## 2017-01-06 MED ORDER — ONDANSETRON HCL 4 MG/2ML IJ SOLN: 4.0000 mg | Freq: Four times a day (QID) | INTRAMUSCULAR | Status: DC | PRN

## 2017-01-06 MED ORDER — ONDANSETRON 4 MG PO TBDP
4.0000 mg | ORAL_TABLET | Freq: Four times a day (QID) | ORAL | Status: DC | PRN
  Filled 2017-01-06: qty 1

## 2017-01-06 MED ORDER — SODIUM CHLORIDE 0.9% FLUSH
3.0000 mL | Freq: Two times a day (BID) | INTRAVENOUS | Status: DC
  Administered 2017-01-06 (×2): 3 mL via INTRAVENOUS

## 2017-01-06 MED ORDER — HYDRALAZINE HCL 20 MG/ML IJ SOLN: 10.0000 mg | INTRAMUSCULAR | Status: DC | PRN
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
## 2017-01-06 NOTE — Progress Notes (Signed)
RN returned pt's belongings in safe to pt. Pt had a pocket knife, green wallet, brown wallet, keys, and $21,992. Pt form states pt had $21,892 on admission. However, two RN's counted $46,568 when returning to pt. Pt's family was present.
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
## 2017-01-06 NOTE — ED Notes (Signed)
Admitting MD at bedside explaining plan of care and admission to pt. and family , [place] C- collar intact .
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
## 2017-01-06 NOTE — Discharge Instructions (Signed)
Cervical Spine Fracture, Stable A cervical spine fracture is a break or crack in one of the bones of the neck. If there is a very low risk of problems happening during healing, the fracture is considered stable. What are the causes? This condition may be caused by:  Motor vehicle accidents.  Injuries from sports such as diving, football, biking, wrestling, or skiing.  Severe osteoporosis or other bone diseases, such as cancers that spread to bone or metabolic abnormalities that cause bone weakness.  What are the signs or symptoms? Symptoms of this condition include:  Severe neck pain after an accident or fall. Pain may spread down the shoulders or arms.  Bruising or swelling on the back of the neck.  Numbness, tingling, sudden muscle tightening (spasms), or weakness in the arms, legs, or both.  How is this diagnosed? This condition may be diagnosed based on:  Your medical history.  A physical exam of your neck, arms, and legs.  Imaging studies of the neck, such as: ? X-rays. ? CT scan. ? MRI.  How is this treated? This condition is treated with a neck brace or cervical collar to keep your neck from moving during the healing process. A cervical collar is a device that supports your chin and the back of your head. You may also be given medicine to help relieve pain. Follow these instructions at home: If you have a neck brace:  Wear the brace as told by your health care provider. Remove it only as told by your health care provider.  If you experience numbness or tingling, loosen the brace.  Keep the brace clean.  If the brace is not waterproof: ? Do not let it get wet. ? Cover it with a watertight covering when you take a bath or a shower. If you have a cervical collar:  Do not remove the collar unless your health care provider tells you to do this. If you are allowed to remove the collar for cleaning and bathing: ? Follow your health care provider's instructions about  how to safely take off the collar. ? Wash and thoroughly dry the skin on your neck. Check your skin for irritation or sores. If you see any, tell your health care provider.  Ask your health care provider before making any adjustments to your collar. Small adjustments may be needed over time to improve comfort and reduce pressure on your chin or on the back of your head.  Keep long hair outside of the collar.  Keep your collar clean by wiping it with mild soap and water and letting it air-dry completely. The pads can be hand-washed with soap and water and air-dried completely. Managing pain, stiffness, and swelling  If directed, put ice on the injured area: ? If you have a removable brace or cervical collar, remove it as told by your health care provider. ? Put ice in a plastic bag. ? Place a towel between your skin and the bag. ? Leave the ice on for 20 minutes, 2-3 times a day. Activity  Do not drive a car until your health care provider approves.  Do not drive or use heavy machinery while taking prescription pain medicine.  Avoid physical activity for as long as directed. Ask your health care provider what activities are safe for you. General instructions  Take over-the-counter and prescription medicines only as told by your health care provider.  Do not take baths, swim, or use a hot tub until your health care provider approves. Ask   your health care provider if you can take showers. You may only be allowed to take sponge baths for bathing.  Do not use any products that contain nicotine or tobacco, such as cigarettes and e-cigarettes. These can delay bone healing. If you need help quitting, ask your health care provider.  Keep all follow-up visits as told by your health care provider. This is important to help prevent long-term (chronic) or permanent injury, pain, and disability. You may need to have follow-up X-rays or MRI 1-3 weeks after your injury. Contact a health care provider  if:  You have irritation or sores on your skin from your brace or cervical collar. Get help right away if:  You have neck pain that gets worse.  You develop difficulties swallowing or breathing.  You develop swelling in your neck.  You have any of the following problems in your arms, legs, or both: ? Numbness. ? Weakness. ? Burning pain. ? Movement problems.  You are unable to control when you urinate or have a bowel movement (incontinence).  You have problems with coordination or difficulty walking. This information is not intended to replace advice given to you by your health care provider. Make sure you discuss any questions you have with your health care provider. Document Released: 12/29/2003 Document Revised: 11/15/2015 Document Reviewed: 11/15/2015 Elsevier Interactive Patient Education  2018 [employer]  

## 2017-01-06 NOTE — Progress Notes (Signed)
Prentiss Bells, RN reviewed discharge instructions with pt and family. Pt and family verbalize understanding. Pt belongings with pt. Pt is not in distress. Pt discharged via wheelchair.

## 2017-01-12 ENCOUNTER — Emergency Department (HOSPITAL_COMMUNITY): Payer: Medicare Other

## 2017-01-12 ENCOUNTER — Emergency Department (HOSPITAL_COMMUNITY)
Admission: EM | Admit: 2017-01-12 | Discharge: 2017-01-12 | Disposition: A | Discharge status: Home / Self Care | Payer: Medicare Other | Attending: Emergency Medicine | Admitting: Emergency Medicine

## 2017-01-12 ENCOUNTER — Encounter (HOSPITAL_COMMUNITY): Payer: Self-pay | Admitting: *Deleted

## 2017-01-12 DIAGNOSIS — I259 Chronic ischemic heart disease, unspecified: Secondary | ICD-10-CM | POA: Insufficient documentation

## 2017-01-12 DIAGNOSIS — C61 Malignant neoplasm of prostate: Secondary | ICD-10-CM | POA: Diagnosis not present

## 2017-01-12 DIAGNOSIS — Z9861 Coronary angioplasty status: Secondary | ICD-10-CM | POA: Insufficient documentation

## 2017-01-12 DIAGNOSIS — R410 Disorientation, unspecified: Secondary | ICD-10-CM | POA: Insufficient documentation

## 2017-01-12 DIAGNOSIS — Z950 Presence of cardiac pacemaker: Secondary | ICD-10-CM | POA: Diagnosis not present

## 2017-01-12 DIAGNOSIS — R51 Headache: Secondary | ICD-10-CM | POA: Insufficient documentation

## 2017-01-12 DIAGNOSIS — Z7901 Long term (current) use of anticoagulants: Secondary | ICD-10-CM | POA: Diagnosis not present

## 2017-01-12 DIAGNOSIS — I10 Essential (primary) hypertension: Secondary | ICD-10-CM | POA: Diagnosis not present

## 2017-01-12 DIAGNOSIS — Z79899 Other long term (current) drug therapy: Secondary | ICD-10-CM | POA: Diagnosis not present

## 2017-01-12 DIAGNOSIS — S12101D Unspecified nondisplaced fracture of second cervical vertebra, subsequent encounter for fracture with routine healing: Secondary | ICD-10-CM | POA: Diagnosis not present

## 2017-01-12 DIAGNOSIS — R519 Headache, unspecified: Secondary | ICD-10-CM

## 2017-01-12 LAB — CBC
HEMATOCRIT: 42.1 % (ref 39.0–52.0)
Hemoglobin: 13.7 g/dL (ref 13.0–17.0)
MCH: 31.2 pg (ref 26.0–34.0)
MCHC: 32.5 g/dL (ref 30.0–36.0)
MCV: 95.9 fL (ref 78.0–100.0)
Platelets: 185 10*3/uL (ref 150–400)
RBC: 4.39 MIL/uL (ref 4.22–5.81)
RDW: 14.2 % (ref 11.5–15.5)
WBC: 6.8 10*3/uL (ref 4.0–10.5)

## 2017-01-12 LAB — BASIC METABOLIC PANEL
ANION GAP: 7 (ref 5–15)
BUN: 20 mg/dL (ref 6–20)
CALCIUM: 9 mg/dL (ref 8.9–10.3)
CO2: 25 mmol/L (ref 22–32)
Chloride: 106 mmol/L (ref 101–111)
Creatinine, Ser: 1.1 mg/dL (ref 0.61–1.24)
GFR calc Af Amer: >60 mL/min (ref >60)
GFR calc non Af Amer: >60 mL/min (ref >60)
GLUCOSE: 121 mg/dL — AB (ref 65–99)
Potassium: 4.3 mmol/L (ref 3.5–5.1)
Sodium: 138 mmol/L (ref 135–145)

## 2017-01-12 MED ORDER — ACETAMINOPHEN 325 MG PO TABS
650.0000 mg | ORAL_TABLET | Freq: Once | ORAL | Status: AC
  Administered 2017-01-12: 650 mg via ORAL
  Filled 2017-01-12: qty 2
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
## 2017-01-12 NOTE — Discharge Instructions (Signed)
As discussed, please follow the instructions regarding post-concussion syndrome.  Get plenty of sleep and avoid activities requiring high concentration.   Stay well-hydrated and follow up with your Primary Care Provider.  Follow up at your appointment on Wednesday with Neurosurgery.  Return sooner if symptoms worsen or new concerning symptoms in the meantime.
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
## 2017-01-12 NOTE — ED Notes (Signed)
Pt on way to CT
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
## 2017-01-12 NOTE — ED Notes (Signed)
Pt family up to NF requesting update, apologized for delay
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
## 2017-01-20 ENCOUNTER — Ambulatory Visit (INDEPENDENT_AMBULATORY_CARE_PROVIDER_SITE_OTHER): Payer: Medicare Other | Admitting: Orthopaedic Surgery

## 2017-08-12 ENCOUNTER — Encounter (HOSPITAL_COMMUNITY): Payer: Self-pay | Admitting: Emergency Medicine

## 2017-08-12 ENCOUNTER — Other Ambulatory Visit: Payer: Self-pay

## 2017-08-12 ENCOUNTER — Emergency Department (HOSPITAL_COMMUNITY): Payer: Medicare Other

## 2017-08-12 ENCOUNTER — Inpatient Hospital Stay (HOSPITAL_COMMUNITY)
Admission: EM | Admit: 2017-08-12 | Discharge: 2017-08-14 | DRG: 247 | Disposition: A | Discharge status: Home / Self Care | Payer: Medicare Other | Attending: Cardiology | Admitting: Cardiology

## 2017-08-12 DIAGNOSIS — Z79899 Other long term (current) drug therapy: Secondary | ICD-10-CM

## 2017-08-12 DIAGNOSIS — I129 Hypertensive chronic kidney disease with stage 1 through stage 4 chronic kidney disease, or unspecified chronic kidney disease: Secondary | ICD-10-CM | POA: Diagnosis present

## 2017-08-12 DIAGNOSIS — E785 Hyperlipidemia, unspecified: Secondary | ICD-10-CM | POA: Diagnosis present

## 2017-08-12 DIAGNOSIS — Z950 Presence of cardiac pacemaker: Secondary | ICD-10-CM

## 2017-08-12 DIAGNOSIS — I2510 Atherosclerotic heart disease of native coronary artery without angina pectoris: Secondary | ICD-10-CM | POA: Diagnosis present

## 2017-08-12 DIAGNOSIS — I200 Unstable angina: Secondary | ICD-10-CM | POA: Diagnosis present

## 2017-08-12 DIAGNOSIS — Z955 Presence of coronary angioplasty implant and graft: Secondary | ICD-10-CM

## 2017-08-12 DIAGNOSIS — R0789 Other chest pain: Secondary | ICD-10-CM | POA: Diagnosis present

## 2017-08-12 DIAGNOSIS — Z7901 Long term (current) use of anticoagulants: Secondary | ICD-10-CM | POA: Diagnosis not present

## 2017-08-12 DIAGNOSIS — I10 Essential (primary) hypertension: Secondary | ICD-10-CM | POA: Diagnosis present

## 2017-08-12 DIAGNOSIS — Z951 Presence of aortocoronary bypass graft: Secondary | ICD-10-CM | POA: Diagnosis not present

## 2017-08-12 DIAGNOSIS — I480 Paroxysmal atrial fibrillation: Secondary | ICD-10-CM | POA: Diagnosis present

## 2017-08-12 DIAGNOSIS — R739 Hyperglycemia, unspecified: Secondary | ICD-10-CM | POA: Diagnosis present

## 2017-08-12 DIAGNOSIS — N183 Chronic kidney disease, stage 3 (moderate): Secondary | ICD-10-CM | POA: Diagnosis present

## 2017-08-12 DIAGNOSIS — R410 Disorientation, unspecified: Secondary | ICD-10-CM

## 2017-08-12 DIAGNOSIS — I25110 Atherosclerotic heart disease of native coronary artery with unstable angina pectoris: Principal | ICD-10-CM | POA: Diagnosis present

## 2017-08-12 DIAGNOSIS — Z8546 Personal history of malignant neoplasm of prostate: Secondary | ICD-10-CM

## 2017-08-12 DIAGNOSIS — I442 Atrioventricular block, complete: Secondary | ICD-10-CM | POA: Diagnosis present

## 2017-08-12 DIAGNOSIS — D696 Thrombocytopenia, unspecified: Secondary | ICD-10-CM | POA: Diagnosis present

## 2017-08-12 HISTORY — DX: Unstable angina: I20.0

## 2017-08-12 LAB — CBC
HCT: 43.8 % (ref 39.0–52.0)
Hemoglobin: 13.3 g/dL (ref 13.0–17.0)
MCH: 29.3 pg (ref 26.0–34.0)
MCHC: 30.4 g/dL (ref 30.0–36.0)
MCV: 96.5 fL (ref 78.0–100.0)
PLATELETS: 57 10*3/uL — AB (ref 150–400)
RBC: 4.54 MIL/uL (ref 4.22–5.81)
RDW: 15.9 % — AB (ref 11.5–15.5)
WBC: 7.8 10*3/uL (ref 4.0–10.5)

## 2017-08-12 LAB — HEPATIC FUNCTION PANEL
ALT: 35 U/L (ref 17–63)
AST: 35 U/L (ref 15–41)
Albumin: 3.6 g/dL (ref 3.5–5.0)
Alkaline Phosphatase: 94 U/L (ref 38–126)
BILIRUBIN INDIRECT: 0.8 mg/dL (ref 0.3–0.9)
Bilirubin, Direct: 0.3 mg/dL (ref 0.1–0.5)
TOTAL PROTEIN: 6.9 g/dL (ref 6.5–8.1)
Total Bilirubin: 1.1 mg/dL (ref 0.3–1.2)

## 2017-08-12 LAB — BASIC METABOLIC PANEL
Anion gap: 7 (ref 5–15)
BUN: 29 mg/dL — AB (ref 6–20)
CALCIUM: 9.2 mg/dL (ref 8.9–10.3)
CO2: 21 mmol/L — ABNORMAL LOW (ref 22–32)
CREATININE: 1.47 mg/dL — AB (ref 0.61–1.24)
Chloride: 110 mmol/L (ref 101–111)
GFR calc Af Amer: 49 mL/min — ABNORMAL LOW (ref >60)
GFR, EST NON AFRICAN AMERICAN: 42 mL/min — AB (ref >60)
Glucose, Bld: 107 mg/dL — ABNORMAL HIGH (ref 65–99)
Potassium: 5.2 mmol/L — ABNORMAL HIGH (ref 3.5–5.1)
SODIUM: 138 mmol/L (ref 135–145)

## 2017-08-12 LAB — I-STAT TROPONIN, ED: Troponin i, poc: 0.03 ng/mL (ref 0.00–0.08)

## 2017-08-12 LAB — TSH: TSH: 1.692 u[IU]/mL (ref 0.350–4.500)

## 2017-08-12 LAB — TROPONIN I: TROPONIN I: 0.03 ng/mL — AB (ref <0.03)

## 2017-08-12 LAB — LIPASE, BLOOD: LIPASE: 42 U/L (ref 11–51)

## 2017-08-12 MED ORDER — ATORVASTATIN CALCIUM 40 MG PO TABS
40.0000 mg | ORAL_TABLET | Freq: Every day | ORAL | Status: DC
  Administered 2017-08-12 – 2017-08-14 (×3): 40 mg via ORAL
  Filled 2017-08-12: qty 2
  Filled 2017-08-12 (×2): qty 1

## 2017-08-12 MED ORDER — SODIUM CHLORIDE 0.9% FLUSH: 3.0000 mL | INTRAVENOUS | Status: DC | PRN

## 2017-08-12 MED ORDER — ALPRAZOLAM 0.25 MG PO TABS
0.2500 mg | ORAL_TABLET | Freq: Two times a day (BID) | ORAL | Status: DC | PRN
  Administered 2017-08-12 – 2017-08-13 (×2): 0.25 mg via ORAL
  Filled 2017-08-12 (×2): qty 1

## 2017-08-12 MED ORDER — LATANOPROST 0.005 % OP SOLN
1.0000 [drp] | Freq: Every day | OPHTHALMIC | Status: DC
  Administered 2017-08-12 – 2017-08-13 (×2): 1 [drp] via OPHTHALMIC
  Filled 2017-08-12: qty 2.5

## 2017-08-12 MED ORDER — NITROGLYCERIN 0.4 MG SL SUBL: 0.4000 mg | SUBLINGUAL_TABLET | SUBLINGUAL | Status: DC | PRN

## 2017-08-12 MED ORDER — ASPIRIN EC 81 MG PO TBEC
81.0000 mg | DELAYED_RELEASE_TABLET | Freq: Every day | ORAL | Status: DC
  Administered 2017-08-13: 81 mg via ORAL
  Filled 2017-08-12: qty 1

## 2017-08-12 MED ORDER — ASPIRIN 81 MG PO CHEW
324.0000 mg | CHEWABLE_TABLET | ORAL | Status: AC
  Administered 2017-08-12: 324 mg via ORAL
  Filled 2017-08-12: qty 4

## 2017-08-12 MED ORDER — NITROGLYCERIN IN D5W 200-5 MCG/ML-% IV SOLN
0.0000 ug/min | INTRAVENOUS | Status: DC
  Administered 2017-08-12: 5 ug/min via INTRAVENOUS
  Filled 2017-08-12: qty 250

## 2017-08-12 MED ORDER — SODIUM CHLORIDE 0.9 % WEIGHT BASED INFUSION
1.0000 mL/kg/h | INTRAVENOUS | Status: DC
  Administered 2017-08-12: 1 mL/kg/h via INTRAVENOUS

## 2017-08-12 MED ORDER — SODIUM CHLORIDE 0.9 % IV SOLN
250.0000 mL | INTRAVENOUS | Status: DC | PRN
  Administered 2017-08-13: 250 mL via INTRAVENOUS

## 2017-08-12 MED ORDER — SODIUM CHLORIDE 0.9% FLUSH
3.0000 mL | Freq: Two times a day (BID) | INTRAVENOUS | Status: DC
  Administered 2017-08-12: 3 mL via INTRAVENOUS

## 2017-08-12 MED ORDER — ACETAMINOPHEN 325 MG PO TABS
650.0000 mg | ORAL_TABLET | ORAL | Status: DC | PRN
  Administered 2017-08-13: 650 mg via ORAL
  Filled 2017-08-12: qty 2

## 2017-08-12 MED ORDER — PANTOPRAZOLE SODIUM 40 MG PO TBEC
40.0000 mg | DELAYED_RELEASE_TABLET | Freq: Every day | ORAL | Status: DC
  Administered 2017-08-12 – 2017-08-14 (×3): 40 mg via ORAL
  Filled 2017-08-12 (×3): qty 1

## 2017-08-12 MED ORDER — ASPIRIN 300 MG RE SUPP: 300.0000 mg | RECTAL | Status: AC

## 2017-08-12 MED ORDER — FERROUS SULFATE 325 (65 FE) MG PO TABS
325.0000 mg | ORAL_TABLET | Freq: Every day | ORAL | Status: DC
  Administered 2017-08-13 – 2017-08-14 (×2): 325 mg via ORAL
  Filled 2017-08-12 (×3): qty 1

## 2017-08-12 MED ORDER — METOPROLOL TARTRATE 50 MG PO TABS
100.0000 mg | ORAL_TABLET | Freq: Two times a day (BID) | ORAL | Status: DC
  Administered 2017-08-12 – 2017-08-14 (×4): 100 mg via ORAL
  Filled 2017-08-12 (×4): qty 2

## 2017-08-12 MED ORDER — LOSARTAN POTASSIUM 50 MG PO TABS: 50.0000 mg | ORAL_TABLET | Freq: Every day | ORAL | Status: DC

## 2017-08-12 MED ORDER — ONDANSETRON HCL 4 MG/2ML IJ SOLN: 4.0000 mg | Freq: Four times a day (QID) | INTRAMUSCULAR | Status: DC | PRN

## 2017-08-12 MED ORDER — BRIMONIDINE TARTRATE 0.2 % OP SOLN
1.0000 [drp] | Freq: Two times a day (BID) | OPHTHALMIC | Status: DC
  Administered 2017-08-12 – 2017-08-14 (×3): 1 [drp] via OPHTHALMIC
  Filled 2017-08-12 (×2): qty 5

## 2017-08-12 NOTE — ED Triage Notes (Signed)
Pt presents with epigastric pain after eating a ribeye last night, pulse 36

## 2017-08-12 NOTE — ED Provider Notes (Signed)
[place] EMERGENCY DEPARTMENT Provider Note   CSN: [id number] Arrival date & time: 08/12/17  1525     History   Chief Complaint Chief Complaint  Patient presents with  . Chest Pain  . GI Problem    "gas"    HPI James Harvey is a 84 y.o. male.  The history is provided by the patient. No language interpreter was used.  Chest Pain    GI Problem  Associated symptoms include chest pain.    James Harvey is a 84 y.o. male who presents to the Emergency Department complaining of chest pain/gas. He presents for evaluation of epigastric pain that he describes as gas. Pain began two days ago after eating a ribeye for breakfast. He states the pain is only present after he ambulates for a few feet to several yards. It is gradual and onset and progressively worsens until he has to sit down. The pain does resolve with rest. He has associated shortness of breath. No nausea, vomiting, diaphoresis, leg swelling or pain. He has a history of coronary artery disease and pacemaker placement. He is on eloquence therapy. He had prior symptoms in the past related to coronary artery disease. He saw his PCP today who referred him to the emergency department for further evaluation.  Past Medical History:  Diagnosis Date  . Anxiety   . Atrioventricular block, complete ([place]) 02/19/10   s/p PPM by JA  . CAD (coronary artery disease)    s/p CABG 02/15/2010 (single vessel)  . Hypertension   . Prostate cancer [facility])     Patient Active Problem List   Diagnosis Date Noted  . Unstable angina ([place]) 08/12/2017  . Closed C2 fracture ([place]) 01/06/2017  . Bilateral hand numbness 07/15/2016  . Other spondylosis with radiculopathy, cervical region 07/15/2016  . CAD (coronary artery disease) 05/24/2010  . Atrioventricular block, complete ([place]) 05/24/2010  . Hypertension 05/24/2010    Past Surgical History:  Procedure Laterality Date  . PACEMAKER INSERTION  02/19/10   by Greggory Brandy for  complete heart block        Home Medications    Prior to Admission medications   Medication Sig Start Date End Date Taking? Authorizing Provider  ALPRAZolam Duanne Moron) 1 MG tablet Take 1 mg daily as needed by mouth for anxiety.   Yes [provider]  apixaban (ELIQUIS) 5 MG TABS tablet Take 1 tablet (5 mg total) 2 (two) times daily by mouth. DO NOT RESTART THIS MEDICATION UNTIL 01/08/17 01/08/17  Yes Meuth, Blaine Hamper, PA-C  Ascorbic Acid (VITAMIN C PO) Take 1 capsule daily by mouth.   Yes [provider]  atorvastatin (LIPITOR) 40 MG tablet Take 40 mg daily by mouth.   Yes [provider]  brimonidine (ALPHAGAN) 0.2 % ophthalmic solution Place 1 drop into both eyes 2 (two) times daily. 07/27/17  Yes [provider]  Cyanocobalamin (VITAMIN B-12 PO) Take 1 tablet daily by mouth.   Yes [provider]  ferrous sulfate 325 (65 FE) MG tablet Take 325 mg daily with breakfast by mouth.   Yes [provider]  losartan (COZAAR) 50 MG tablet Take 50 mg daily by mouth.   Yes [provider]  metoprolol tartrate (LOPRESSOR) 50 MG tablet Take 50 mg 2 (two) times daily by mouth.   Yes [provider]  Multiple Vitamin (MULTIVITAMIN) tablet Take 1 tablet by mouth daily.     Yes [provider]  Nutritional Supplements (COMPLEAT PO) Take 1 tablet by  mouth daily.   Yes [provider]  omeprazole (PRILOSEC) 40 MG capsule Take 40 mg daily by mouth.   Yes [provider]  Travoprost, BAK Free, (TRAVATAN Z) 0.004 % SOLN ophthalmic solution Place 1 drop into both eyes at bedtime. 07/27/17  Yes [provider]    Family History Family History  Problem Relation Age of Onset  . Hypertension Unknown     Social History Social History   Tobacco Use  . Smoking status: Never Smoker  . Smokeless tobacco: Never Used  Substance Use Topics  . Alcohol use: Yes  . Drug use: No     Allergies   Patient has no  known allergies.   Review of Systems Review of Systems  Cardiovascular: Positive for chest pain.  All other systems reviewed and are negative.    Physical Exam Updated Vital Signs BP (!) 94/49 (BP Location: Left Arm)   Pulse 65   Temp 98.2 F (36.8 C) (Oral)   Resp (!) 25   Ht 5\' 9"  (1.753 m)   Wt 96.5 kg (212 lb 11.9 oz)   SpO2 97%   BMI 31.42 kg/m   Physical Exam  Constitutional: He is oriented to person, place, and time. He appears well-developed and well-nourished.  HENT:  Head: Normocephalic and atraumatic.  Cardiovascular:  No murmur heard. Bradycardic and irregular  Pulmonary/Chest: Effort normal. No respiratory distress. He exhibits no tenderness.  Fine crackles in the left lung base  Abdominal: Soft. There is no tenderness. There is no rebound and no guarding.  Musculoskeletal: He exhibits no edema or tenderness.  2+ DP pulses bilaterally  Neurological: He is alert and oriented to person, place, and time.  Skin: Skin is warm and dry.  Psychiatric: He has a normal mood and affect. His behavior is normal.  Nursing note and vitals reviewed.    ED Treatments / Results  Labs (all labs ordered are listed, but only abnormal results are displayed) Labs Reviewed  BASIC METABOLIC PANEL - Abnormal; Notable for the following components:      Result Value   Potassium 5.2 (*)    CO2 21 (*)    Glucose, Bld 107 (*)    BUN 29 (*)    Creatinine, Ser 1.47 (*)    GFR calc non Af Amer 42 (*)    GFR calc Af Amer 49 (*)    All other components within normal limits  CBC - Abnormal; Notable for the following components:   RDW 15.9 (*)    Platelets 57 (*)    All other components within normal limits  TROPONIN I - Abnormal; Notable for the following components:   Troponin I 0.03 (*)    All other components within normal limits  MRSA PCR SCREENING  HEPATIC FUNCTION PANEL  LIPASE, BLOOD  TSH  TROPONIN I  TROPONIN I  BASIC METABOLIC PANEL  CBC  LIPID PANEL  I-STAT  TROPONIN, ED    EKG EKG Interpretation  Date/Time:  Wednesday August 12 2017 15:31:27 EDT Ventricular Rate:  66 PR Interval:    QRS Duration: 152 QT Interval:  410 QTC Calculation: 429 R Axis:   -167 Text Interpretation:  Sinus rhythm with 1st degree A-V block with frequent atrial-paced complexes and Premature supraventricular complexes in a pattern of bigeminy Right bundle branch block Possible Lateral infarct , age undetermined Abnormal ECG Confirmed by Quintella Reichert [phone]) on 08/12/2017 3:55:15 PM   Radiology Dg Chest 2 View  Result Date: 08/12/2017 CLINICAL DATA:  Chest pain and epigastric pain. EXAM: CHEST - 2 VIEW COMPARISON:  Chest x-rays dated 01/05/2017 and 11/19/2016 FINDINGS: Chronic cardiomegaly. Pacemaker in place. Previous CABG. No infiltrates or effusions. Chronic accentuation of the interstitial markings at the lung bases. No acute bone abnormality. IMPRESSION: No acute abnormality. Chronic cardiomegaly. Chronic accentuation of the interstitial markings at the lung bases. Electronically Signed   By: Lorriane Shire M.D.   On: 08/12/2017 17:24    Procedures Procedures (including critical care time)  Medications Ordered in ED Medications  aspirin EC tablet 81 mg (has no administration in time range)  nitroGLYCERIN (NITROSTAT) SL tablet 0.4 mg (has no administration in time range)  acetaminophen (TYLENOL) tablet 650 mg (has no administration in time range)  ondansetron (ZOFRAN) injection 4 mg (has no administration in time range)  nitroGLYCERIN 50 mg in dextrose 5 % 250 mL (0.2 mg/mL) infusion (20 mcg/min Intravenous Transfusing/Transfer 08/12/17 2140)  ALPRAZolam (XANAX) tablet 0.25 mg (0.25 mg Oral Given 08/12/17 2254)  atorvastatin (LIPITOR) tablet 40 mg (40 mg Oral Given 08/12/17 2045)  brimonidine (ALPHAGAN) 0.2 % ophthalmic solution 1 drop (1 drop Both Eyes Given 08/12/17 2247)  ferrous sulfate tablet 325 mg (has no administration in time range)  losartan (COZAAR)  tablet 50 mg (has no administration in time range)  metoprolol tartrate (LOPRESSOR) tablet 100 mg (100 mg Oral Given 08/12/17 2246)  pantoprazole (PROTONIX) EC tablet 40 mg (40 mg Oral Given 08/12/17 2046)  latanoprost (XALATAN) 0.005 % ophthalmic solution 1 drop (1 drop Both Eyes Given 08/12/17 2247)  sodium chloride flush (NS) 0.9 % injection 3 mL (3 mLs Intravenous Given 08/12/17 2254)  sodium chloride flush (NS) 0.9 % injection 3 mL (has no administration in time range)  0.9 %  sodium chloride infusion (has no administration in time range)  0.9% sodium chloride infusion (1 mL/kg/hr  96.5 kg Intravenous New Bag/Given 08/12/17 2244)  aspirin chewable tablet 324 mg (324 mg Oral Given 08/12/17 1923)    Or  aspirin suppository 300 mg ( Rectal See Alternative 08/12/17 1923)     Initial Impression / Assessment and Plan / ED Course  I have reviewed the triage vital signs and the nursing notes.  Pertinent labs & imaging results that were available during my care of the patient were reviewed by me and considered in my medical decision making (see chart for details).    Patient here for evaluation of epigastric pain, bradycardia for the last few days. He has no symptoms at rest but does have significant symptoms with exertion. Cardiology consulted for further evaluation and management.  Final Clinical Impressions(s) / ED Diagnoses   Final diagnoses:  None    ED Discharge Orders    None       Quintella Reichert, MD 08/13/17 [phone]

## 2017-08-12 NOTE — ED Provider Notes (Signed)
Patient placed in Quick Look pathway, seen and evaluated   Chief Complaint: chest pain    HPI:   84 year old male with a history of complete AV heart block status post pacemaker in 2011 [place] today with chest pain.  Patient notes pain and shortness of breath with ambulation in the lower chest, he notes this is similar to previous symptoms prior to pacemaker.  Patient noted to have a heart rate in the 30s at primary care provider's office.  He denies any dizziness or lightheadedness at rest, denies any chest pain at rest.  ROS: Chest pain (one)  Physical Exam:   Gen: No distress  Neuro: Awake and Alert  Skin: Warm    Focused Exam: Bradycardic rhythm with a heart rate of 36   Initiation of care has begun. The patient has been counseled on the process, plan, and necessity for staying for the completion/evaluation, and the remainder of the medical screening examination   Francee Gentile 08/12/17 1546    Carmin Muskrat, MD 08/13/17 2136

## 2017-08-12 NOTE — ED Notes (Signed)
Report attempted x1. Nurse upstairs unable to take report.

## 2017-08-12 NOTE — H&P (Signed)
James Harvey  is an 84 y.o. male.   Chief Complaint: Chest pain HPI: James Harvey  is a 84 y.o. male  With Patient with known coronary artery disease status post CABG in 2011, sick sinus syndrome S/P Medtronic permanent pacemaker implantation at the time of CABG, paroxysmal atrial fibrillation first documented on the pacemaker on 04/18/2014, had been doing well until last 4 to 5 days ago started noticing exertional chest tightness associated with shortness of breath.  He thought it was acid reflux and has been taking PPI.  Every time he exerts he feels chest tightness in the middle of the chest.  Is also noticed that his exercise capacity has decreased.  He was seen by his PCP who recommended that he go to the emergency room.  No rest pain, no PND or orthopnea, no leg edema.  Denies symptoms of claudication, TIA or neurologic deficits.  Past Medical History:  Diagnosis Date  . Anxiety   . Atrioventricular block, complete ([place]) 02/19/10   s/p PPM by JA  . CAD (coronary artery disease)    s/p CABG 02/15/2010 (single vessel)  . Hypertension   . Prostate cancer [facility])     Past Surgical History:  Procedure Laterality Date  . PACEMAKER INSERTION  02/19/10   by Greggory Brandy for complete heart block    Family History  Problem Relation Age of Onset  . Hypertension Unknown    Social History:  reports that he has never smoked. He has never used smokeless tobacco. He reports that he drinks alcohol. He reports that he does not use drugs.  Allergies: No Known Allergies  Review of Systems  Constitutional: Negative.   Eyes: Negative.   Respiratory: Positive for shortness of breath. Negative for cough and wheezing.   Cardiovascular: Positive for chest pain. Negative for palpitations, orthopnea, leg swelling and PND.  Gastrointestinal: Positive for heartburn. Negative for abdominal pain, nausea and vomiting.  Genitourinary: Negative.   Musculoskeletal: Negative.   Skin: Negative.   Neurological:  Negative.   Endo/Heme/Allergies: Negative.   Psychiatric/Behavioral: Negative.    Blood pressure 137/62, pulse (!) 34, temperature 97.9 F (36.6 C), temperature source Oral, resp. rate 14, SpO2 94 %. There is no height or weight on file to calculate BMI.  Physical Exam  Constitutional: He is oriented to person, place, and time. He appears well-developed and well-nourished. No distress.  HENT:  Head: Atraumatic.  Eyes: Conjunctivae are normal.  Neck: Normal range of motion. Neck supple.  Cardiovascular: Normal rate, regular rhythm, normal heart sounds and intact distal pulses. Exam reveals no friction rub.  No murmur heard. Pulmonary/Chest: Effort normal. He has rales (both bases, coarse rales).  Abdominal: Soft. Bowel sounds are normal. He exhibits no distension. There is no tenderness.  Musculoskeletal: Normal range of motion. He exhibits no edema or tenderness.  Neurological: He is alert and oriented to person, place, and time.  Skin: Skin is warm and dry. He is not diaphoretic.  Psychiatric: He has a normal mood and affect.   Results for orders placed or performed during the hospital encounter of 08/12/17 (from the past 48 hour(s))  Basic metabolic panel     Status: Abnormal   Collection Time: 08/12/17  3:44 PM  Result Value Ref Range   Sodium 138 135 - 145 mmol/L   Potassium 5.2 (H) 3.5 - 5.1 mmol/L   Chloride 110 101 - 111 mmol/L   CO2 21 (L) 22 - 32 mmol/L   Glucose, Bld 107 (H) 65 - 99 mg/dL  BUN 29 (H) 6 - 20 mg/dL   Creatinine, Ser 1.47 (H) 0.61 - 1.24 mg/dL   Calcium 9.2 8.9 - 10.3 mg/dL   GFR calc non Af Amer 42 (L) >60 mL/min   GFR calc Af Amer 49 (L) >60 mL/min    Comment: (NOTE) The eGFR has been calculated using the CKD EPI equation. This calculation has not been validated in all clinical situations. eGFR's persistently <60 mL/min signify possible Chronic Kidney Disease.    Anion gap 7 5 - 15    Comment: Performed at [facility] [address]  [place], [place] [address]  CBC     Status: Abnormal   Collection Time: 08/12/17  3:44 PM  Result Value Ref Range   WBC 7.8 4.0 - 10.5 K/uL   RBC 4.54 4.22 - 5.81 MIL/uL   Hemoglobin 13.3 13.0 - 17.0 g/dL   HCT 43.8 39.0 - 52.0 %   MCV 96.5 78.0 - 100.0 fL   MCH 29.3 26.0 - 34.0 pg   MCHC 30.4 30.0 - 36.0 g/dL   RDW 15.9 (H) 11.5 - 15.5 %   Platelets 57 (L) 150 - 400 K/uL    Comment: REPEATED TO VERIFY SPECIMEN CHECKED FOR CLOTS PLATELET COUNT CONFIRMED BY SMEAR Performed at [facility], [place] [address], [place], [place] [address]   I-stat troponin, ED     Status: None   Collection Time: 08/12/17  3:53 PM  Result Value Ref Range   Troponin i, poc 0.03 0.00 - 0.08 ng/mL   Comment 3            Comment: Due to the release kinetics of cTnI, a negative result within the first hours of the onset of symptoms does not rule out myocardial infarction with certainty. If myocardial infarction is still suspected, repeat the test at appropriate intervals.   Hepatic function panel     Status: None   Collection Time: 08/12/17  5:38 PM  Result Value Ref Range   Total Protein 6.9 6.5 - 8.1 g/dL   Albumin 3.6 3.5 - 5.0 g/dL   AST 35 15 - 41 U/L   ALT 35 17 - 63 U/L   Alkaline Phosphatase 94 38 - 126 U/L   Total Bilirubin 1.1 0.3 - 1.2 mg/dL   Bilirubin, Direct 0.3 0.1 - 0.5 mg/dL   Indirect Bilirubin 0.8 0.3 - 0.9 mg/dL    Comment: Performed at [facility] [address] [place], [place] [address]  Lipase, blood     Status: None   Collection Time: 08/12/17  5:38 PM  Result Value Ref Range   Lipase 42 11 - 51 U/L    Comment: Performed at [facility] [address] [place], [place] [address]    Labs:   Lab Results  Component Value Date   WBC 7.8 08/12/2017   HGB 13.3 08/12/2017   HCT 43.8 08/12/2017   MCV 96.5 08/12/2017   PLT 57 (L) 08/12/2017   Recent Labs  Lab 08/12/17 1544 08/12/17 1738  NA 138  --   K 5.2*  --   CL 110  --   CO2 21*  --   BUN 29*   --   CREATININE 1.47*  --   CALCIUM 9.2  --   PROT  --  6.9  BILITOT  --  1.1  ALKPHOS  --  94  ALT  --  35  AST  --  35  GLUCOSE  107*  --     Lipid Panel     Component Value Date/Time   CHOL  02/14/2010 0620    108        ATP III CLASSIFICATION:  <200     mg/dL   Desirable  200-239  mg/dL   Borderline High  >=240    mg/dL   High          TRIG 79 02/14/2010 0620   HDL 45 02/14/2010 0620   CHOLHDL 2.4 02/14/2010 0620   VLDL 16 02/14/2010 0620   LDLCALC  02/14/2010 0620    47        Total Cholesterol/HDL:CHD Risk Coronary Heart Disease Risk Table                     Men   Women  1/2 Average Risk   3.4   3.3  Average Risk       5.0   4.4  2 X Average Risk   9.6   7.1  3 X Average Risk  23.4   11.0        Use the calculated Patient Ratio above and the CHD Risk Table to determine the patient's CHD Risk.        ATP III CLASSIFICATION (LDL):  <100     mg/dL   Optimal  100-129  mg/dL   Near or Above                    Optimal  130-159  mg/dL   Borderline  160-189  mg/dL   High  >190     mg/dL   Very High     Current Facility-Administered Medications:  .  acetaminophen (TYLENOL) tablet 650 mg, 650 mg, Oral, Q4H PRN, Adrian Prows, MD .  ALPRAZolam Duanne Moron) tablet 0.25 mg, 0.25 mg, Oral, BID PRN, Adrian Prows, MD .  aspirin chewable tablet 324 mg, 324 mg, Oral, NOW **OR** aspirin suppository 300 mg, 300 mg, Rectal, NOW, Adrian Prows, MD .  Derrill Memo ON 08/13/2017] aspirin EC tablet 81 mg, 81 mg, Oral, Daily, Adrian Prows, MD .  atorvastatin (LIPITOR) tablet 40 mg, 40 mg, Oral, Daily, Adrian Prows, MD .  brimonidine (ALPHAGAN) 0.2 % ophthalmic solution 1 drop, 1 drop, Both Eyes, BID, Adrian Prows, MD .  Derrill Memo ON 08/13/2017] ferrous sulfate tablet 325 mg, 325 mg, Oral, Q breakfast, Adrian Prows, MD .  latanoprost (XALATAN) 0.005 % ophthalmic solution 1 drop, 1 drop, Both Eyes, QHS, Adrian Prows, MD .  losartan (COZAAR) tablet 50 mg, 50 mg, Oral, Daily, Adrian Prows, MD .  metoprolol tartrate  (LOPRESSOR) tablet 100 mg, 100 mg, Oral, BID, Adrian Prows, MD .  nitroGLYCERIN (NITROSTAT) SL tablet 0.4 mg, 0.4 mg, Sublingual, Q5 Min x 3 PRN, Adrian Prows, MD .  nitroGLYCERIN 50 mg in dextrose 5 % 250 mL (0.2 mg/mL) infusion, 0-30 mcg/min, Intravenous, Titrated, Adrian Prows, MD .  ondansetron (ZOFRAN) injection 4 mg, 4 mg, Intravenous, Q6H PRN, Adrian Prows, MD .  pantoprazole (PROTONIX) EC tablet 40 mg, 40 mg, Oral, Daily, Adrian Prows, MD  Current Outpatient Medications:  .  ALPRAZolam (XANAX) 1 MG tablet, Take 1 mg daily as needed by mouth for anxiety., Disp: , Rfl:  .  apixaban (ELIQUIS) 5 MG TABS tablet, Take 1 tablet (5 mg total) 2 (two) times daily by mouth. DO NOT RESTART THIS MEDICATION UNTIL 01/08/17, Disp: 60 tablet, Rfl:  .  Ascorbic Acid (VITAMIN C PO), Take 1 capsule  daily by mouth., Disp: , Rfl:  .  atorvastatin (LIPITOR) 40 MG tablet, Take 40 mg daily by mouth., Disp: , Rfl:  .  brimonidine (ALPHAGAN) 0.2 % ophthalmic solution, Place 1 drop into both eyes 2 (two) times daily., Disp: , Rfl:  .  Cyanocobalamin (VITAMIN B-12 PO), Take 1 tablet daily by mouth., Disp: , Rfl:  .  ferrous sulfate 325 (65 FE) MG tablet, Take 325 mg daily with breakfast by mouth., Disp: , Rfl:  .  losartan (COZAAR) 50 MG tablet, Take 50 mg daily by mouth., Disp: , Rfl:  .  metoprolol tartrate (LOPRESSOR) 50 MG tablet, Take 50 mg 2 (two) times daily by mouth., Disp: , Rfl:  .  Multiple Vitamin (MULTIVITAMIN) tablet, Take 1 tablet by mouth daily.  , Disp: , Rfl:  .  Nutritional Supplements (COMPLEAT PO), Take 1 tablet by mouth daily., Disp: , Rfl:  .  omeprazole (PRILOSEC) 40 MG capsule, Take 40 mg daily by mouth., Disp: , Rfl:  .  Travoprost, BAK Free, (TRAVATAN Z) 0.004 % SOLN ophthalmic solution, Place 1 drop into both eyes at bedtime., Disp: , Rfl:   CARDIAC STUDIES:  EKG 08/12/2017: A paced rhythm with first-degree AV block with frequent PVCs in the form of bigeminy, occasional ventricular couplets.   Underlying right bundle branch block.  Nonspecific T abnormality.  Low voltage complexes.  ECHO ordered  Assessment/Plan   1.  Chest pain suggestive of unstable angina that started about 4 to 5 days ago, previously appears to have had stable angina pectoris. 2.  CAD S/P CABG, 2011 with LIMA to LAD and SVG to D1 had revealed no evidence of ischemia.  Last echocardiogram was in 2013 revealing normal LVEF.  Last nuclear stress test 2017 normal 3.  Sick sinus syndrome S/P permanent pacemaker implantation with Medtronic pacemaker, Pacemaker interrogation done today reveals normal functioning pacemaker.  He has had one brief atrial tachycardia.  Normal trends and thresholds.  Exline 4.  Paroxysmal atrial fibrillation, CHA2DS2-VASCScore: Risk Score  4,  Yearly risk of stroke  4. Recommendation: ASA No/Anticoagulation Yes and on Eliquis 4.  Hypertension 5.  Hyperlipidemia 6.  Hyperglycemia 7.  Stage III chronic kidney disease due to hypertension  Recommendation: Patient with chest pain suggestive of accelerated angina pectoris, unstable angina.  New frequent PVCs and ventricular couplets may be ischemia driven.  Admit and start IV NTG, we will schedule him for cardiac catheterization in the morning.  He is out of the risks and benefits associated with cardiac catheterization.  He is otherwise on appropriate medical therapy, will hold Eliquis for now, will increase metoprolol from 50 mg to 100 mg twice daily in view of elevated blood pressure and frequent PVCs.  Renal function has remained stable.  We will follow-up on his lipids.  We will hydrate him well prior to angiography.  If he has hyperglycemia but not diabetes.   Adrian Prows, MD 08/12/2017, 7:01 PM [facility] [place] Pager: [phone] Office: [phone] If no answer: Cell:  [phone]

## 2017-08-12 NOTE — ED Notes (Signed)
Pacemaker interrogation completed by [place], Therapist, sports

## 2017-08-12 NOTE — ED Notes (Signed)
Report attempted x2

## 2017-08-13 ENCOUNTER — Inpatient Hospital Stay (HOSPITAL_COMMUNITY): Payer: Medicare Other

## 2017-08-13 ENCOUNTER — Encounter (HOSPITAL_COMMUNITY)
Admission: EM | Disposition: A | Discharge status: Home / Self Care | Payer: Self-pay | Source: Home / Self Care | Attending: Cardiology

## 2017-08-13 ENCOUNTER — Encounter (HOSPITAL_COMMUNITY): Payer: Self-pay | Admitting: Cardiology

## 2017-08-13 HISTORY — PX: LEFT HEART CATH AND CORS/GRAFTS ANGIOGRAPHY: CATH118250

## 2017-08-13 HISTORY — PX: CORONARY STENT INTERVENTION: CATH118234

## 2017-08-13 LAB — CBC
HCT: 40.8 % (ref 39.0–52.0)
Hemoglobin: 12.8 g/dL — ABNORMAL LOW (ref 13.0–17.0)
MCH: 29.8 pg (ref 26.0–34.0)
MCHC: 31.4 g/dL (ref 30.0–36.0)
MCV: 94.9 fL (ref 78.0–100.0)
Platelets: 57 10*3/uL — ABNORMAL LOW (ref 150–400)
RBC: 4.3 MIL/uL (ref 4.22–5.81)
RDW: 15.9 % — ABNORMAL HIGH (ref 11.5–15.5)
WBC: 6.2 10*3/uL (ref 4.0–10.5)

## 2017-08-13 LAB — BASIC METABOLIC PANEL
ANION GAP: 7 (ref 5–15)
BUN: 29 mg/dL — ABNORMAL HIGH (ref 6–20)
CO2: 21 mmol/L — ABNORMAL LOW (ref 22–32)
Calcium: 8.9 mg/dL (ref 8.9–10.3)
Chloride: 111 mmol/L (ref 101–111)
Creatinine, Ser: 1.48 mg/dL — ABNORMAL HIGH (ref 0.61–1.24)
GFR calc Af Amer: 49 mL/min — ABNORMAL LOW (ref >60)
GFR, EST NON AFRICAN AMERICAN: 42 mL/min — AB (ref >60)
GLUCOSE: 117 mg/dL — AB (ref 65–99)
Potassium: 4.9 mmol/L (ref 3.5–5.1)
Sodium: 139 mmol/L (ref 135–145)

## 2017-08-13 LAB — TROPONIN I
TROPONIN I: 0.03 ng/mL — AB (ref <0.03)
TROPONIN I: 0.03 ng/mL — AB (ref <0.03)

## 2017-08-13 LAB — LIPID PANEL
Cholesterol: 90 mg/dL (ref 0–200)
HDL: 34 mg/dL — ABNORMAL LOW (ref >40)
LDL CALC: 40 mg/dL (ref 0–99)
Total CHOL/HDL Ratio: 2.6 RATIO
Triglycerides: 82 mg/dL (ref <150)
VLDL: 16 mg/dL (ref 0–40)

## 2017-08-13 LAB — POCT ACTIVATED CLOTTING TIME: Activated Clotting Time: 500 seconds

## 2017-08-13 LAB — MRSA PCR SCREENING: MRSA BY PCR: NEGATIVE

## 2017-08-13 SURGERY — LEFT HEART CATH AND CORS/GRAFTS ANGIOGRAPHY
Anesthesia: LOCAL

## 2017-08-13 MED ORDER — NITROGLYCERIN 0.4 MG SL SUBL
SUBLINGUAL_TABLET | SUBLINGUAL | Status: AC
  Filled 2017-08-13: qty 1

## 2017-08-13 MED ORDER — HEPARIN SODIUM (PORCINE) 1000 UNIT/ML IJ SOLN
INTRAMUSCULAR | Status: DC | PRN
  Administered 2017-08-13: 5000 [IU] via INTRAVENOUS
  Administered 2017-08-13 (×2): 3000 [IU] via INTRAVENOUS

## 2017-08-13 MED ORDER — CLOPIDOGREL BISULFATE 300 MG PO TABS
ORAL_TABLET | ORAL | Status: AC
  Filled 2017-08-13: qty 2

## 2017-08-13 MED ORDER — FAMOTIDINE IN NACL 20-0.9 MG/50ML-% IV SOLN
INTRAVENOUS | Status: AC | PRN
  Administered 2017-08-13: 20 mg via INTRAVENOUS

## 2017-08-13 MED ORDER — LOSARTAN POTASSIUM 50 MG PO TABS
50.0000 mg | ORAL_TABLET | Freq: Every day | ORAL | Status: DC
  Administered 2017-08-14: 50 mg via ORAL
  Filled 2017-08-13: qty 1

## 2017-08-13 MED ORDER — HEPARIN SODIUM (PORCINE) 1000 UNIT/ML IJ SOLN
INTRAMUSCULAR | Status: AC
  Filled 2017-08-13: qty 1

## 2017-08-13 MED ORDER — HEPARIN (PORCINE) IN NACL 2-0.9 UNITS/ML
INTRAMUSCULAR | Status: AC | PRN
  Administered 2017-08-13: 1000 mL

## 2017-08-13 MED ORDER — CLOPIDOGREL BISULFATE 300 MG PO TABS
ORAL_TABLET | ORAL | Status: DC | PRN
  Administered 2017-08-13: 600 mg via ORAL

## 2017-08-13 MED ORDER — NITROGLYCERIN 1 MG/10 ML FOR IR/CATH LAB
INTRA_ARTERIAL | Status: AC
  Filled 2017-08-13: qty 10

## 2017-08-13 MED ORDER — NITROGLYCERIN 0.4 MG SL SUBL
SUBLINGUAL_TABLET | SUBLINGUAL | Status: DC | PRN
  Administered 2017-08-13: .4 mg via SUBLINGUAL

## 2017-08-13 MED ORDER — IOHEXOL 350 MG/ML SOLN
INTRAVENOUS | Status: DC | PRN
  Administered 2017-08-13: 200 mL via INTRA_ARTERIAL

## 2017-08-13 MED ORDER — MIDAZOLAM HCL 2 MG/2ML IJ SOLN
INTRAMUSCULAR | Status: DC | PRN
  Administered 2017-08-13: 1 mg via INTRAVENOUS

## 2017-08-13 MED ORDER — FAMOTIDINE IN NACL 20-0.9 MG/50ML-% IV SOLN
INTRAVENOUS | Status: AC
  Filled 2017-08-13: qty 50

## 2017-08-13 MED ORDER — MIDAZOLAM HCL 2 MG/2ML IJ SOLN
INTRAMUSCULAR | Status: AC
  Filled 2017-08-13: qty 2

## 2017-08-13 MED ORDER — LIDOCAINE HCL (PF) 1 % IJ SOLN
INTRAMUSCULAR | Status: DC | PRN
  Administered 2017-08-13: 2 mL via INTRADERMAL

## 2017-08-13 MED ORDER — CLOPIDOGREL BISULFATE 75 MG PO TABS
75.0000 mg | ORAL_TABLET | Freq: Every day | ORAL | Status: DC
  Administered 2017-08-14: 75 mg via ORAL
  Filled 2017-08-13: qty 1

## 2017-08-13 MED ORDER — FENTANYL CITRATE (PF) 100 MCG/2ML IJ SOLN
INTRAMUSCULAR | Status: AC
  Filled 2017-08-13: qty 2

## 2017-08-13 MED ORDER — SIMETHICONE 80 MG PO CHEW
80.0000 mg | CHEWABLE_TABLET | Freq: Three times a day (TID) | ORAL | Status: DC | PRN
  Administered 2017-08-13: 80 mg via ORAL
  Filled 2017-08-13 (×2): qty 1

## 2017-08-13 MED ORDER — HEPARIN (PORCINE) IN NACL 1000-0.9 UT/500ML-% IV SOLN
INTRAVENOUS | Status: AC
  Filled 2017-08-13: qty 1000

## 2017-08-13 MED ORDER — NITROGLYCERIN 1 MG/10 ML FOR IR/CATH LAB
INTRA_ARTERIAL | Status: DC | PRN
  Administered 2017-08-13 (×2): 200 ug via INTRACORONARY

## 2017-08-13 MED ORDER — VERAPAMIL HCL 2.5 MG/ML IV SOLN
INTRAVENOUS | Status: AC
  Filled 2017-08-13: qty 2

## 2017-08-13 MED ORDER — FENTANYL CITRATE (PF) 100 MCG/2ML IJ SOLN
INTRAMUSCULAR | Status: DC | PRN
  Administered 2017-08-13: 50 ug via INTRAVENOUS

## 2017-08-13 MED ORDER — APIXABAN 5 MG PO TABS
5.0000 mg | ORAL_TABLET | Freq: Two times a day (BID) | ORAL | Status: DC
  Administered 2017-08-13 – 2017-08-14 (×2): 5 mg via ORAL
  Filled 2017-08-13 (×3): qty 1

## 2017-08-13 MED ORDER — VERAPAMIL HCL 2.5 MG/ML IV SOLN
INTRAVENOUS | Status: DC | PRN
  Administered 2017-08-13: 10 mL via INTRA_ARTERIAL
  Administered 2017-08-13: 5 mL via INTRA_ARTERIAL

## 2017-08-13 MED ORDER — LIDOCAINE HCL (PF) 1 % IJ SOLN
INTRAMUSCULAR | Status: AC
  Filled 2017-08-13: qty 30

## 2017-08-13 SURGICAL SUPPLY — 21 items
BALLN SAPPHIRE ~~LOC~~ 3.25X12 (BALLOONS) ×1 IMPLANT
CATH INFINITI 5 FR AR1 MOD (CATHETERS) ×1 IMPLANT
CATH INFINITI 5 FR IM (CATHETERS) ×2 IMPLANT
CATH INFINITI 5 FR LCB (CATHETERS) ×1 IMPLANT
CATH INFINITI 5FR MULTPACK ANG (CATHETERS) ×1 IMPLANT
CATH LAUNCHER 6FR EBU 3.75 (CATHETERS) ×1 IMPLANT
CATH LAUNCHER 6FR EBU3.5 (CATHETERS) ×1 IMPLANT
DEVICE RAD COMP TR BAND LRG (VASCULAR PRODUCTS) ×1 IMPLANT
GLIDESHEATH SLEND A-KIT 6F 22G (SHEATH) ×1 IMPLANT
GUIDEWIRE INQWIRE 1.5J.035X260 (WIRE) IMPLANT
INQWIRE 1.5J .035X260CM (WIRE) ×2
KIT ENCORE 26 ADVANTAGE (KITS) ×1 IMPLANT
KIT HEART LEFT (KITS) ×2 IMPLANT
KIT HEMO VALVE WATCHDOG (MISCELLANEOUS) ×1 IMPLANT
PACK CARDIAC CATHETERIZATION (CUSTOM PROCEDURE TRAY) ×2 IMPLANT
SHIELD RADPAD SCOOP 12X17 (MISCELLANEOUS) ×1 IMPLANT
STENT SIERRA 3.00 X 15 MM (Permanent Stent) ×1 IMPLANT
TRANSDUCER W/STOPCOCK (MISCELLANEOUS) ×2 IMPLANT
TUBING CIL FLEX 10 FLL-RA (TUBING) ×2 IMPLANT
WIRE COUGAR XT STRL 190CM (WIRE) ×2 IMPLANT
WIRE HI TORQ VERSACORE-J 145CM (WIRE) ×1 IMPLANT

## 2017-08-13 NOTE — Progress Notes (Signed)
Patient is confused he knows where he is but confused to situation. He is refusing care and walked out of his room down the hall. I had to stand one on one with him I was unable to get him back to his room. I did call his son James Harvey who was already on his way in. James Harvey stated patient get confused at night because of a accident that he hit his head in. James Harvey was able to get patient back into room and sitting with him at this time. I will continue to monitor and provide care as the patient allows. No bleeding at this time to radial site. Patient non compliant to restrictions with using the hand.

## 2017-08-13 NOTE — Progress Notes (Signed)
Dr Virgina Jock in to see pt and pain level has decreased to level 6 and pt stated he felt better. Pt  Transported  to [facility]

## 2017-08-13 NOTE — Progress Notes (Signed)
  Echocardiogram 2D Echocardiogram has been performed.  James Harvey F 08/13/2017, 10:41 AM

## 2017-08-13 NOTE — H&P (View-Only) (Signed)
Subjective:  No chest pain, shortness of breath this morning Has headache since being on nitroglycerin.  Objective:  Vital Signs in the last 24 hours: Temp:  [97.2 F (36.2 C)-98.2 F (36.8 C)] 97.3 F (36.3 C) (06/20 0459) Pulse Rate:  [31-94] 56 (06/20 0459) Resp:  [14-30] 25 (06/19 2130) BP: (94-154)/(47-104) 120/75 (06/20 0459) SpO2:  [90 %-99 %] 95 % (06/20 0459) Weight:  [96.5 kg (212 lb 11.9 oz)] 96.5 kg (212 lb 11.9 oz) (06/19 2155)  Intake/Output from previous day: 06/19 0701 - 06/20 0700 In: 561 [I.V.:561] Out: -  Intake/Output from this shift: No intake/output data recorded.  Physical Exam: Constitutional: He is oriented to person, place, and time. He appears well-developed and well-nourished. No distress.  HENT:  Head: Atraumatic.  Eyes: Conjunctivae are normal.  Neck: Normal range of motion. Neck supple.  Cardiovascular: Normal rate, regular rhythm, normal heart sounds and intact distal pulses. Exam reveals no friction rub.  No murmur heard. Pulmonary/Chest: Effort normal. He has no rales  Abdominal: Soft. Bowel sounds are normal. He exhibits no distension. There is no tenderness.  Musculoskeletal: Normal range of motion. He exhibits no edema or tenderness.  Neurological: He is alert and oriented to person, place, and time.  Skin: Skin is warm and dry. He is not diaphoretic.  Psychiatric: He has a normal mood and affect.     Lab Results: Recent Labs    08/12/17 1544 08/13/17 0659  WBC 7.8 6.2  HGB 13.3 12.8*  PLT 57* 57*   Recent Labs    08/12/17 1544 08/13/17 0659  NA 138 139  K 5.2* 4.9  CL 110 111  CO2 21* 21*  GLUCOSE 107* 117*  BUN 29* 29*  CREATININE 1.47* 1.48*   Recent Labs    08/13/17 0025 08/13/17 0659  TROPONINI 0.03* 0.03*   Hepatic Function Panel Recent Labs    08/12/17 1738  PROT 6.9  ALBUMIN 3.6  AST 35  ALT 35  ALKPHOS 94  BILITOT 1.1  BILIDIR 0.3  IBILI 0.8   Recent Labs    08/13/17 0025  CHOL 90    CARDIAC STUDIES:  EKG 08/12/2017: A paced rhythm with first-degree AV block with frequent PVCs in the form of bigeminy, occasional ventricular couplets.  Underlying right bundle branch block.  Nonspecific T abnormality.  Low voltage complexes.  Echocardiogram pending    Assessment: 84 y/o male  Chest pain: Possible unstable angina CAD: S?p CABG 2011, LIMA-LAD, SVG-D1 Hypertension S/p dual chamber pacemaker Thrombocytopenia: New since 2018, stable. No bleeding CKD 3: Stable One episode of atrial fibrillation in 2016  Plan: Coronary and bypass graft angiography today. Continue aspirin/lipito/metoprolol. Can stop nitroglycerin gtt. Hold losartan this morning to reduce risk of CIN Continue IV hydration. Should he need P2Y12 inhibitor, Recommend plavix given his thrombocytopenia. If he is going to need eliquis, recommend no aspirin. His Afib episode was back in 2016 with no recurrence. I will discuss with his primary cardiologist Dr. Einar Gip, if we could consider stopping eliquis, in view of his thrombocytopenia and bleeding risk while on plavix.  He may need outpatient referral to hematology regarding what appears to be idiopathic thrombocytopenia. Since there is no prior exposure to heparin, I do not think this is HIT. We can use heparin for cath.    LOS: 1 day    James Harvey 08/13/2017, 8:43 AM  [place], MD [facility] [place] Pager: [phone] Office: [phone] If no answer Cell [phone]

## 2017-08-13 NOTE — Progress Notes (Signed)
Subjective:  No chest pain, shortness of breath this morning Has headache since being on nitroglycerin.  Objective:  Vital Signs in the last 24 hours: Temp:  [97.2 F (36.2 C)-98.2 F (36.8 C)] 97.3 F (36.3 C) (06/20 0459) Pulse Rate:  [31-94] 56 (06/20 0459) Resp:  [14-30] 25 (06/19 2130) BP: (94-154)/(47-104) 120/75 (06/20 0459) SpO2:  [90 %-99 %] 95 % (06/20 0459) Weight:  [96.5 kg (212 lb 11.9 oz)] 96.5 kg (212 lb 11.9 oz) (06/19 2155)  Intake/Output from previous day: 06/19 0701 - 06/20 0700 In: 561 [I.V.:561] Out: -  Intake/Output from this shift: No intake/output data recorded.  Physical Exam: Constitutional: He is oriented to person, place, and time. He appears well-developed and well-nourished. No distress.  HENT:  Head: Atraumatic.  Eyes: Conjunctivae are normal.  Neck: Normal range of motion. Neck supple.  Cardiovascular: Normal rate, regular rhythm, normal heart sounds and intact distal pulses. Exam reveals no friction rub.  No murmur heard. Pulmonary/Chest: Effort normal. He has no rales  Abdominal: Soft. Bowel sounds are normal. He exhibits no distension. There is no tenderness.  Musculoskeletal: Normal range of motion. He exhibits no edema or tenderness.  Neurological: He is alert and oriented to person, place, and time.  Skin: Skin is warm and dry. He is not diaphoretic.  Psychiatric: He has a normal mood and affect.     Lab Results: Recent Labs    08/12/17 1544 08/13/17 0659  WBC 7.8 6.2  HGB 13.3 12.8*  PLT 57* 57*   Recent Labs    08/12/17 1544 08/13/17 0659  NA 138 139  K 5.2* 4.9  CL 110 111  CO2 21* 21*  GLUCOSE 107* 117*  BUN 29* 29*  CREATININE 1.47* 1.48*   Recent Labs    08/13/17 0025 08/13/17 0659  TROPONINI 0.03* 0.03*   Hepatic Function Panel Recent Labs    08/12/17 1738  PROT 6.9  ALBUMIN 3.6  AST 35  ALT 35  ALKPHOS 94  BILITOT 1.1  BILIDIR 0.3  IBILI 0.8   Recent Labs    08/13/17 0025  CHOL 90    CARDIAC STUDIES:  EKG 08/12/2017: A paced rhythm with first-degree AV block with frequent PVCs in the form of bigeminy, occasional ventricular couplets.  Underlying right bundle branch block.  Nonspecific T abnormality.  Low voltage complexes.  Echocardiogram pending    Assessment: 84 y/o male  Chest pain: Possible unstable angina CAD: S?p CABG 2011, LIMA-LAD, SVG-D1 Hypertension S/p dual chamber pacemaker Thrombocytopenia: New since 2018, stable. No bleeding CKD 3: Stable One episode of atrial fibrillation in 2016  Plan: Coronary and bypass graft angiography today. Continue aspirin/lipito/metoprolol. Can stop nitroglycerin gtt. Hold losartan this morning to reduce risk of CIN Continue IV hydration. Should he need P2Y12 inhibitor, Recommend plavix given his thrombocytopenia. If he is going to need eliquis, recommend no aspirin. His Afib episode was back in 2016 with no recurrence. I will discuss with his primary cardiologist Dr. Einar Gip, if we could consider stopping eliquis, in view of his thrombocytopenia and bleeding risk while on plavix.  He may need outpatient referral to hematology regarding what appears to be idiopathic thrombocytopenia. Since there is no prior exposure to heparin, I do not think this is HIT. We can use heparin for cath.    LOS: 1 day    Avy Barlett J Jeilani Grupe 08/13/2017, 8:43 AM  [place], MD [facility] [place] Pager: [phone] Office: [phone] If no answer Cell [phone]

## 2017-08-13 NOTE — Interval H&P Note (Signed)
History and Physical Interval Note:  08/13/2017 11:43 AM  James Harvey  has presented today for surgery, with the diagnosis of unstable angina  The various methods of treatment have been discussed with the patient and family. After consideration of risks, benefits and other options for treatment, the patient has consented to  Procedure(s): LEFT HEART CATH AND CORS/GRAFTS ANGIOGRAPHY (N/A) as a surgical intervention .  The patient's history has been reviewed, patient examined, no change in status, stable for surgery.  I have reviewed the patient's chart and labs.  Questions were answered to the patient's satisfaction.    2016 Appropriate Use Criteria for Coronary Revascularization in Patients With Acute Coronary Syndrome NSTEMI/UA High Risk (TIMI Score 5-7)  NSTEMI/Unstable angina, stabilized patient at high risk Link Here: sistemancia.com Indication:  Revascularization by PCI or CABG of 1 or more arteries in a patient with NSTEMI or unstable angina with Stabilization after presentation High risk for clinical events  A (7) Indication: 16; Score 7     [place]

## 2017-08-14 ENCOUNTER — Inpatient Hospital Stay (HOSPITAL_COMMUNITY): Payer: Medicare Other

## 2017-08-14 DIAGNOSIS — I480 Paroxysmal atrial fibrillation: Secondary | ICD-10-CM

## 2017-08-14 DIAGNOSIS — D696 Thrombocytopenia, unspecified: Secondary | ICD-10-CM

## 2017-08-14 DIAGNOSIS — R410 Disorientation, unspecified: Secondary | ICD-10-CM

## 2017-08-14 DIAGNOSIS — Z950 Presence of cardiac pacemaker: Secondary | ICD-10-CM

## 2017-08-14 LAB — CBC WITH DIFFERENTIAL/PLATELET
Abs Immature Granulocytes: 0 10*3/uL (ref 0.0–0.1)
BASOS ABS: 0 10*3/uL (ref 0.0–0.1)
Basophils Relative: 0 %
EOS ABS: 0 10*3/uL (ref 0.0–0.7)
EOS PCT: 0 %
HCT: 43.9 % (ref 39.0–52.0)
Hemoglobin: 13.6 g/dL (ref 13.0–17.0)
Immature Granulocytes: 0 %
Lymphocytes Relative: 20 %
Lymphs Abs: 1.4 10*3/uL (ref 0.7–4.0)
MCH: 30 pg (ref 26.0–34.0)
MCHC: 31 g/dL (ref 30.0–36.0)
MCV: 96.7 fL (ref 78.0–100.0)
MONO ABS: 0.7 10*3/uL (ref 0.1–1.0)
MONOS PCT: 10 %
Neutro Abs: 4.9 10*3/uL (ref 1.7–7.7)
Neutrophils Relative %: 70 %
PLATELETS: 38 10*3/uL — AB (ref 150–400)
RBC: 4.54 MIL/uL (ref 4.22–5.81)
RDW: 16.3 % — AB (ref 11.5–15.5)
WBC: 7.1 10*3/uL (ref 4.0–10.5)

## 2017-08-14 LAB — BASIC METABOLIC PANEL
Anion gap: 10 (ref 5–15)
BUN: 28 mg/dL — AB (ref 6–20)
CO2: 19 mmol/L — ABNORMAL LOW (ref 22–32)
CREATININE: 1.58 mg/dL — AB (ref 0.61–1.24)
Calcium: 9.4 mg/dL (ref 8.9–10.3)
Chloride: 113 mmol/L — ABNORMAL HIGH (ref 101–111)
GFR calc Af Amer: 45 mL/min — ABNORMAL LOW (ref >60)
GFR, EST NON AFRICAN AMERICAN: 39 mL/min — AB (ref >60)
GLUCOSE: 108 mg/dL — AB (ref 65–99)
Potassium: 5.5 mmol/L — ABNORMAL HIGH (ref 3.5–5.1)
Sodium: 142 mmol/L (ref 135–145)

## 2017-08-14 LAB — ECHOCARDIOGRAM COMPLETE
HEIGHTINCHES: 69 in
Weight: 3403.9024 oz

## 2017-08-14 MED ORDER — ASPIRIN 81 MG PO CHEW: 81.0000 mg | CHEWABLE_TABLET | Freq: Every day | ORAL | 3 refills | Status: DC

## 2017-08-14 MED ORDER — HALOPERIDOL LACTATE 5 MG/ML IJ SOLN
2.0000 mg | Freq: Four times a day (QID) | INTRAMUSCULAR | Status: AC | PRN
  Administered 2017-08-14: 2 mg via INTRAVENOUS
  Filled 2017-08-14: qty 1

## 2017-08-14 MED ORDER — HALOPERIDOL LACTATE 5 MG/ML IJ SOLN
INTRAMUSCULAR | Status: AC
  Administered 2017-08-14: 2 mg
  Filled 2017-08-14: qty 1

## 2017-08-14 MED ORDER — ASPIRIN 81 MG PO CHEW: 81.0000 mg | CHEWABLE_TABLET | Freq: Every day | ORAL | Status: DC

## 2017-08-14 MED ORDER — NITROGLYCERIN 0.4 MG SL SUBL: 0.4000 mg | SUBLINGUAL_TABLET | SUBLINGUAL | 3 refills | Status: AC | PRN

## 2017-08-14 MED ORDER — LORAZEPAM 2 MG/ML IJ SOLN
1.0000 mg | Freq: Once | INTRAMUSCULAR | Status: AC
  Administered 2017-08-14: 1 mg via INTRAVENOUS
  Filled 2017-08-14: qty 1

## 2017-08-14 MED ORDER — CLOPIDOGREL BISULFATE 75 MG PO TABS: 75.0000 mg | ORAL_TABLET | Freq: Every day | ORAL | 3 refills | Status: AC

## 2017-08-14 MED ORDER — HALOPERIDOL LACTATE 5 MG/ML IJ SOLN: 4.0000 mg | Freq: Four times a day (QID) | INTRAMUSCULAR | Status: DC | PRN
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
## 2017-08-14 NOTE — Care Management Note (Addendum)
Case Management Note  Patient Details  Name: James Harvey MRN: [id number] Date of Birth: 1932/09/05  Subjective/Objective:      Pt admitted with unstable angina            Action/Plan:   PTA from home .  Pt has been cleared for discharge back to the home.  Pt independently ambulating in the room per bedside nurse, Pt's son informed CM that he will remain with pt 24/7.  Pt has PCP and family denied barriers with obtaining/paying for medications.  Confusion/delirum addressed with attending - pt still deemed appropriate for discharge back to the home with 24 hour supervision.   Family and pt eager for pt to return to familiar surroundings in the home.  Family declined [facility]   Expected Discharge Date:  08/14/17               Expected Discharge Plan:  Home/Self Care  In-House Referral:  Clinical Social Work  Discharge planning Services  CM Consult  Post Acute Care Choice:    Choice offered to:     DME Arranged:    DME Agency:     HH Arranged:    [place] Agency:     Status of Service:  Completed, signed off  If discussed at [employer], dates discussed:    Additional Comments:  Maryclare Labrador, RN 08/14/2017, 1:57 PM

## 2017-08-14 NOTE — Discharge Instructions (Signed)

## 2017-08-14 NOTE — Progress Notes (Signed)
Received call that pt is for d/c, we did not have an order. RN put in order for Cardiac Rehab Phase I. Pt has been walking as he has been delirious. Brief ed done with pt and son. Reinforced Plavix/Eliquis importance and warned son that he must keep his dad from falling, to have tight supervision until his delirium clears. Gave walking gl, diet sheet and will refer to [place].  [id number] James Harvey CES, ACSM 12:46 PM 08/14/2017

## 2017-08-14 NOTE — Plan of Care (Signed)
Pt. Received all discharge paperwork.  Pt. Continues to seemed very confused and having hallucinations. MD believes it is ICU delirium and feels pt is safe to go home. Family at bedside states they fell comfortable taking pt. Home and will stay with him 24/7 until mentality has returned back to normal. Pts.family received discharge instructions and verbalized understanding.

## 2017-08-14 NOTE — Progress Notes (Signed)
Patient continue to be confused and non complaint pulling heart monitor off, getting out of bed walking down the hall trying to leave the unit and walking into other patients room. MD notified medication  given per order but ineffective. Patient continues to walk the halls trying to leave. Order for restraints but unable to get on the patient he won't get back in the bed or sit in one place. Family at bedside trying to calm the patient. I will continue to monitor him.

## 2017-08-17 ENCOUNTER — Telehealth (HOSPITAL_COMMUNITY): Payer: Self-pay

## 2017-08-17 NOTE — Telephone Encounter (Signed)
Pt insurance is active and benefits verified through Medicare. Co-pay $0.00, DED $185.00/$185.00 met, out of pocket $0.00/$0.00 met, co-insurance 20%. No pre-authorization reqiured. [employer]port, 08/17/17 @ 8:29AM, [phone]  Secondary insurance is active and benefits verified through [facility]. Co-pay $0.00, DED $0.00/$0.00 met, out of pocket $0.00/$0.00 met, co-insurance 0%. No pre-authorization required. BC[employer]/[name], 08/17/17 @ 12:44PM, ref#[id number]  Will contact patient to see if HE/SHE is interested in the Cardiac Rehab Program. If interested, patient will need to complete follow up appt. Once completed, patient will be contacted for scheduling upon review by the RN Navigator.

## 2017-08-18 ENCOUNTER — Encounter (HOSPITAL_COMMUNITY): Payer: Self-pay | Admitting: Cardiology

## 2017-09-21 ENCOUNTER — Telehealth (HOSPITAL_COMMUNITY): Payer: Self-pay

## 2017-09-21 NOTE — Telephone Encounter (Signed)
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 Patient not interested at this time. Closed referral.

## 2018-04-13 DIAGNOSIS — Z950 Presence of cardiac pacemaker: Secondary | ICD-10-CM

## 2018-04-13 DIAGNOSIS — I4891 Unspecified atrial fibrillation: Secondary | ICD-10-CM

## 2018-04-13 DIAGNOSIS — I495 Sick sinus syndrome: Secondary | ICD-10-CM | POA: Diagnosis not present

## 2018-04-13 DIAGNOSIS — Z45018 Encounter for adjustment and management of other part of cardiac pacemaker: Secondary | ICD-10-CM

## 2018-04-20 ENCOUNTER — Encounter: Payer: Self-pay | Admitting: Cardiology

## 2018-04-20 ENCOUNTER — Other Ambulatory Visit: Payer: Self-pay | Admitting: Cardiology

## 2018-04-20 DIAGNOSIS — R739 Hyperglycemia, unspecified: Secondary | ICD-10-CM

## 2018-04-20 DIAGNOSIS — I442 Atrioventricular block, complete: Secondary | ICD-10-CM

## 2018-04-20 DIAGNOSIS — N183 Chronic kidney disease, stage 3 unspecified: Secondary | ICD-10-CM

## 2018-04-20 DIAGNOSIS — I25118 Atherosclerotic heart disease of native coronary artery with other forms of angina pectoris: Secondary | ICD-10-CM

## 2018-04-20 DIAGNOSIS — E7800 Pure hypercholesterolemia, unspecified: Secondary | ICD-10-CM

## 2018-04-20 DIAGNOSIS — I480 Paroxysmal atrial fibrillation: Secondary | ICD-10-CM

## 2018-04-20 DIAGNOSIS — Z950 Presence of cardiac pacemaker: Secondary | ICD-10-CM

## 2018-04-20 DIAGNOSIS — I10 Essential (primary) hypertension: Secondary | ICD-10-CM

## 2018-04-20 DIAGNOSIS — Z125 Encounter for screening for malignant neoplasm of prostate: Secondary | ICD-10-CM

## 2018-04-20 HISTORY — DX: Hyperglycemia, unspecified: R73.9

## 2018-04-20 HISTORY — DX: Chronic kidney disease, stage 3 unspecified: N18.30

## 2018-04-20 HISTORY — DX: Pure hypercholesterolemia, unspecified: E78.00

## 2018-04-20 MED ORDER — APIXABAN 5 MG PO TABS: 5.0000 mg | ORAL_TABLET | Freq: Two times a day (BID) | ORAL | 6 refills | Status: AC

## 2018-04-20 NOTE — Progress Notes (Signed)
Subjective:  Primary Physician:  Tamsen Roers, MD  Patient ID: James Harvey, male    DOB: 1933/01/18, 85 y.o.   MRN: [id number]  No chief complaint on file.   HPI: James Harvey  is a 85 y.o. male  with HTN, hyperlipidemia, stage 3 CKD, hyperglycemia, chronic diastolic heart failure and coronary artery disease S/P CABG in 2011 and sick sinus syndrome and high degree AV block S/P Medtronic permanent pacemaker also at the same time. Presented with ACS and underwent Coronary angiogram on 08/13/2017 S/P stenting to mid LAD with 3 mm DES due to nonfunctional but patent LIMA to LAD.   Past Medical History:  Diagnosis Date  . Anxiety   . Atrioventricular block, complete ([place]) 02/19/10   s/p PPM by JA  . CAD (coronary artery disease)    s/p CABG 02/15/2010 (single vessel)  . CKD (chronic kidney disease) stage 3, GFR 30-59 ml/min ([place]) 04/20/2018  . Hypercholesterolemia 04/20/2018  . Hyperglycemia 04/20/2018  . Hypertension   . Prostate cancer ([place])   . Unstable angina ([place]) 08/12/2017    Past Surgical History:  Procedure Laterality Date  . CORONARY STENT INTERVENTION N/A 08/13/2017   Procedure: CORONARY STENT INTERVENTION;  Surgeon: Nigel Mormon, MD;  Location: [place] [facility];  Service: Cardiovascular;  Laterality: N/A;  . LEFT HEART CATH AND CORS/GRAFTS ANGIOGRAPHY N/A 08/13/2017   Procedure: LEFT HEART CATH AND CORS/GRAFTS ANGIOGRAPHY;  Surgeon: Nigel Mormon, MD;  Location: [place] [facility];  Service: Cardiovascular;  Laterality: N/A;  . PACEMAKER INSERTION  02/19/10   by Greggory Brandy for complete heart block    Social History   Socioeconomic History  . Marital status: Divorced    Spouse name: Not on file  . Number of children: Not on file  . Years of education: Not on file  . Highest education level: Not on file  Occupational History  . Not on file  Social Needs  . Financial resource strain: Not on file  . Food insecurity:    Worry: Not on file    Inability:  Not on file  . Transportation needs:    Medical: Not on file    Non-medical: Not on file  Tobacco Use  . Smoking status: Never Smoker  . Smokeless tobacco: Never Used  Substance and Sexual Activity  . Alcohol use: Yes  . Drug use: No  . Sexual activity: Not on file  Lifestyle  . Physical activity:    Days per week: Not on file    Minutes per session: Not on file  . Stress: Not on file  Relationships  . Social connections:    Talks on phone: Not on file    Gets together: Not on file    Attends religious service: Not on file    Active member of club or organization: Not on file    Attends meetings of clubs or organizations: Not on file    Relationship status: Not on file  . Intimate partner violence:    Fear of current or ex partner: Not on file    Emotionally abused: Not on file    Physically abused: Not on file    Forced sexual activity: Not on file  Other Topics Concern  . Not on file  Social History Narrative  . Not on file    Current Outpatient Medications on File Prior to Visit  Medication Sig Dispense Refill  . ALPRAZolam (XANAX) 1 MG tablet Take 1 mg daily as needed by mouth  for anxiety.    . Ascorbic Acid (VITAMIN C PO) Take 1 capsule daily by mouth.    Marland Kitchen atorvastatin (LIPITOR) 40 MG tablet Take 40 mg daily by mouth.    . brimonidine (ALPHAGAN) 0.2 % ophthalmic solution Place 1 drop into both eyes 2 (two) times daily.    . clopidogrel (PLAVIX) 75 MG tablet Take 1 tablet (75 mg total) by mouth daily. 90 tablet 3  . Cyanocobalamin (VITAMIN B-12 PO) Take 1 tablet daily by mouth.    . ferrous sulfate 325 (65 FE) MG tablet Take 325 mg daily with breakfast by mouth.    . losartan (COZAAR) 50 MG tablet Take 50 mg daily by mouth.    . metoprolol tartrate (LOPRESSOR) 50 MG tablet Take 50 mg 2 (two) times daily by mouth.    . Multiple Vitamin (MULTIVITAMIN) tablet Take 1 tablet by mouth daily.      . nitroGLYCERIN (NITROSTAT) 0.4 MG SL tablet Place 1 tablet (0.4 mg total)  under the tongue every 5 (five) minutes x 3 doses as needed for chest pain. 30 tablet 3  . Nutritional Supplements (COMPLEAT PO) Take 1 tablet by mouth daily.    Marland Kitchen omeprazole (PRILOSEC) 40 MG capsule Take 40 mg daily by mouth.    . Travoprost, BAK Free, (TRAVATAN Z) 0.004 % SOLN ophthalmic solution Place 1 drop into both eyes at bedtime.     No current facility-administered medications on file prior to visit.      Review of Systems  Constitutional: Negative for malaise/fatigue and weight loss.  Respiratory: Positive for shortness of breath. Negative for cough and hemoptysis.   Cardiovascular: Positive for leg swelling. Negative for chest pain, palpitations and claudication.  Gastrointestinal: Negative for abdominal pain, blood in stool, constipation, heartburn and vomiting.  Genitourinary: Negative for dysuria.  Musculoskeletal: Negative for joint pain and myalgias.  Neurological: Negative for dizziness, focal weakness and headaches.  Endo/Heme/Allergies: Does not bruise/bleed easily.  Psychiatric/Behavioral: Negative for depression. The patient is not nervous/anxious.   All other systems reviewed and are negative.      Objective:  There were no vitals taken for this visit. There is no height or weight on file to calculate BMI.  Physical Exam  Constitutional: He appears well-developed and well-nourished. No distress.  HENT:  Head: Atraumatic.  Eyes: Conjunctivae are normal.  Neck: Neck supple. No JVD present. No thyromegaly present.  Cardiovascular: Normal rate, regular rhythm, normal heart sounds and intact distal pulses. Exam reveals no gallop.  No murmur heard. Pulmonary/Chest: Effort normal and breath sounds normal.  Pacemaker pocket noted in left infraclavicular region  Abdominal: Soft. Bowel sounds are normal.  Musculoskeletal: Normal range of motion.        General: No edema.  Neurological: He is alert.  Skin: Skin is warm and dry.  Psychiatric: He has a normal mood  and affect.    CARDIAC STUDIES:   Echocardiogram  at [facility]. Echocardiogram 08/13/2017: Normal LV size, EF 50-50%, abnormal septal motion secondary to paced rhythm. Mild to moderate AI, moderate left atrial enlargement, mild RV dilatation, systolic function normal. PA pressure 24 mmHg. Assessment & Recommendations:   1. Screening for prostate cancer - PSA; Future  2. Paroxysmal atrial fibrillation (HCC) - apixaban (ELIQUIS) 5 MG TABS tablet; Take 1 tablet (5 mg total) by mouth 2 (two) times daily.  3. Coronary artery disease of native artery of native heart with stable angina pectoris [facility])  Coronary angiogram 08/13/2017:  (CABG 02/15/10 LIMA to LAD and  SVG to D1 by Dr. Roxy Manns): Patent LIMA to LAD however with very minimal flow with multiple branches.  Native LAD focal 80% mid stenosis followed by diffuse stenosis both proximally and distally to the high-grade stenosis.  SVG to D1 patent.  Circumflex and right coronary normal.  S/P 3.0 x 15 mm Xience Sierra stenting to mid LAD.  4. Atrioventricular block, complete ([place])  5. S/P placement of cardiac pacemaker Pacemaker implant 02/20/10 Medtronic  Adapta L Dual Chamber pacemaker model [id number] for complete heart block.  Remote Pacemaker transmission 04/07/2018: 2.18.20: 1st doc for AF. There were 53 atrial high rate episodes detected. The longest lasted >96 hrs in duration. There was a 9.8 % cumulative atrial arrhythmia burden. EGMs show AT/AF/AFL/VP. There were 10 high ventricular rate episodes detected. EGMs suggest nsVTs for 6-19 bts. Health trends do not demonstrate significant abnormality. Battery longevity is 3-6.5 years. RA pacing is 14.9 %, RV pacing is 95.3 %  Scheduled In Person Pacemaker Check 08/19/2017:  Battery life > 5 years. AP < 2%, VP >20%. Mode changed from AAI-MVP mode to DDD due to prolonged PR and non-conducted beats. Base rate increased from 55 to 60/min. Normal threshold and impedance. No mode switches.   6.  Essential hypertension  7. CKD (chronic kidney disease) stage 3, GFR 30-59 ml/min (HCC)  8. Hyperglycemia  9. Hypercholesterolemia  10.  Laboratory examination Labs 09/08/2017: Serum glucose 95 mg, BUN 24, creatinine 1.56, eGFR 40/47 mL, potassium 5.3.  HB 10.3/HCT 31.2, platelets 208.  BNP. 372.  Labs 08/18/2017:  H/H 12.6/38.7.  MCV 93.  Platelets 73, Platelet's increase from 37K on 08/13/17.   Recommendation:  I reviewed the results of the recently performed remote pacemaker interrogation, patient is willing to start Eliquis, I will continue Plavix until one year his completed due to prior ACS.  He requests PSA to be performed as well as he has history of BPH which I will perform all his recent labs that'll be performed next week.  He has an appointment to see me following the labs.    Adrian Prows, MD, [facility] 04/20/2018, 8:38 AM [facility] [place] Pager: [phone] Office: [phone] If no answer Cell [phone]

## 2018-04-21 ENCOUNTER — Other Ambulatory Visit: Payer: Self-pay | Admitting: Cardiology

## 2018-04-22 LAB — COMPREHENSIVE METABOLIC PANEL
ALK PHOS: 111 IU/L (ref 39–117)
ALT: 28 IU/L (ref 0–44)
AST: 33 IU/L (ref 0–40)
Albumin/Globulin Ratio: 1.4 (ref 1.2–2.2)
Albumin: 4.2 g/dL (ref 3.6–4.6)
BILIRUBIN TOTAL: 0.7 mg/dL (ref 0.0–1.2)
BUN / CREAT RATIO: 21 (ref 10–24)
BUN: 26 mg/dL (ref 8–27)
CHLORIDE: 106 mmol/L (ref 96–106)
CO2: 21 mmol/L (ref 20–29)
CREATININE: 1.26 mg/dL (ref 0.76–1.27)
Calcium: 9.6 mg/dL (ref 8.6–10.2)
GFR calc Af Amer: 61 mL/min/{1.73_m2} (ref >59)
GFR calc non Af Amer: 52 mL/min/{1.73_m2} — ABNORMAL LOW (ref >59)
GLOBULIN, TOTAL: 2.9 g/dL (ref 1.5–4.5)
Glucose: 114 mg/dL — ABNORMAL HIGH (ref 65–99)
POTASSIUM: 4.9 mmol/L (ref 3.5–5.2)
SODIUM: 141 mmol/L (ref 134–144)
Total Protein: 7.1 g/dL (ref 6.0–8.5)

## 2018-04-22 LAB — CBC WITH DIFFERENTIAL/PLATELET
BASOS ABS: 0 10*3/uL (ref 0.0–0.2)
BASOS: 1 %
EOS (ABSOLUTE): 0.1 10*3/uL (ref 0.0–0.4)
EOS: 2 %
HEMATOCRIT: 39.3 % (ref 37.5–51.0)
Hemoglobin: 13 g/dL (ref 13.0–17.7)
Immature Grans (Abs): 0 10*3/uL (ref 0.0–0.1)
Immature Granulocytes: 1 %
LYMPHS ABS: 1.5 10*3/uL (ref 0.7–3.1)
Lymphs: 23 %
MCH: 30.9 pg (ref 26.6–33.0)
MCHC: 33.1 g/dL (ref 31.5–35.7)
MCV: 93 fL (ref 79–97)
MONOCYTES: 8 %
MONOS ABS: 0.6 10*3/uL (ref 0.1–0.9)
NEUTROS ABS: 4.4 10*3/uL (ref 1.4–7.0)
Neutrophils: 65 %
Platelets: 133 10*3/uL — ABNORMAL LOW (ref 150–450)
RBC: 4.21 x10E6/uL (ref 4.14–5.80)
RDW: 13.5 % (ref 11.6–15.4)
WBC: 6.6 10*3/uL (ref 3.4–10.8)

## 2018-04-22 LAB — LIPID PANEL W/O CHOL/HDL RATIO
CHOLESTEROL TOTAL: 106 mg/dL (ref 100–199)
HDL: 55 mg/dL (ref >39)
LDL CALC: 39 mg/dL (ref 0–99)
TRIGLYCERIDES: 61 mg/dL (ref 0–149)
VLDL Cholesterol Cal: 12 mg/dL (ref 5–40)

## 2018-04-22 LAB — PSA: Prostate Specific Ag, Serum: <0.1 ng/mL (ref 0.0–4.0)

## 2018-04-26 ENCOUNTER — Telehealth: Payer: Self-pay

## 2018-04-26 NOTE — Telephone Encounter (Signed)
Pt daughter called c/o ear bleeding and the ENT wants you to check on the dose of eliquis ; They think its abnormal for him to bleed like that for just an ear infection

## 2018-04-27 NOTE — Telephone Encounter (Signed)
He is on appropriate dose

## 2018-04-28 NOTE — Telephone Encounter (Signed)
Pt will come in Friday for his appt to talk to jg then

## 2018-04-30 ENCOUNTER — Encounter: Payer: Self-pay | Admitting: Cardiology

## 2018-04-30 ENCOUNTER — Ambulatory Visit (INDEPENDENT_AMBULATORY_CARE_PROVIDER_SITE_OTHER): Payer: Medicare Other | Admitting: Cardiology

## 2018-04-30 VITALS — BP 143/73 | HR 74 | Ht 68.0 in | Wt 208.0 lb

## 2018-04-30 DIAGNOSIS — Z950 Presence of cardiac pacemaker: Secondary | ICD-10-CM | POA: Diagnosis not present

## 2018-04-30 DIAGNOSIS — I25118 Atherosclerotic heart disease of native coronary artery with other forms of angina pectoris: Secondary | ICD-10-CM

## 2018-04-30 DIAGNOSIS — D696 Thrombocytopenia, unspecified: Secondary | ICD-10-CM

## 2018-04-30 DIAGNOSIS — N183 Chronic kidney disease, stage 3 unspecified: Secondary | ICD-10-CM

## 2018-04-30 DIAGNOSIS — Z45018 Encounter for adjustment and management of other part of cardiac pacemaker: Secondary | ICD-10-CM

## 2018-04-30 DIAGNOSIS — I480 Paroxysmal atrial fibrillation: Secondary | ICD-10-CM

## 2018-04-30 DIAGNOSIS — I442 Atrioventricular block, complete: Secondary | ICD-10-CM

## 2018-04-30 DIAGNOSIS — E7800 Pure hypercholesterolemia, unspecified: Secondary | ICD-10-CM

## 2018-04-30 HISTORY — DX: Encounter for adjustment and management of other part of cardiac pacemaker: Z45.018

## 2018-04-30 NOTE — Progress Notes (Signed)
Subjective:  Primary Physician:  Tamsen Roers, MD  Patient ID: James Harvey, male    DOB: [birth year], 85 y.o.   MRN: [id number]  Chief Complaint  Patient presents with  . Coronary Artery Disease    f/u of CHF  . Atrial Fibrillation    HPI: James Harvey  is a 85 y.o. male  with HTN, hyperlipidemia, stage 3 CKD, hyperglycemia and coronary artery disease S/P CABG in 2011 and sick sinus syndrome and high degree AV block S/P Medtronic permanent pacemaker also at the same time. Presented with ACS and underwent Coronary angiogram on 08/13/2017 S/P stenting to mid LAD with 3 mm DES due to nonfunctional but patent LIMA to LAD. He has paroxysmal atrial fibrillation noted in 2017 and again on recent pacemaker transmission in February 2020.  All the telephone had started him on Eliquis.  He now presents here for follow-up of chronic diastolic heart failure, atrial fibrillation and to discuss labs. He has had recurrent episodes of acute diastolic heart failure but over the past 2 months states that he has been doing well and has not had any recurrence of dyspnea or leg edema.  Denies chest pain or palpitations.  No bleeding diathesis.  Recently has had an ear infection and had bleeding in his ear but no recurrence.  Past Medical History:  Diagnosis Date  . Anxiety   . Atrioventricular block, complete ([place]) 02/19/10   s/p PPM by JA  . CAD (coronary artery disease)    s/p CABG 02/15/2010 (single vessel)  . CKD (chronic kidney disease) stage 3, GFR 30-59 ml/min ([place]) 04/20/2018  . Encounter for care of pacemaker 04/30/2018  . Hypercholesterolemia 04/20/2018  . Hyperglycemia 04/20/2018  . Hypertension   . Prostate cancer ([place])   . Unstable angina ([place]) 08/12/2017    Past Surgical History:  Procedure Laterality Date  . CORONARY STENT INTERVENTION N/A 08/13/2017   Procedure: CORONARY STENT INTERVENTION;  Surgeon: Nigel Mormon, MD;  Location: [facility];  Service: Cardiovascular;   Laterality: N/A;  . LEFT HEART CATH AND CORS/GRAFTS ANGIOGRAPHY N/A 08/13/2017   Procedure: LEFT HEART CATH AND CORS/GRAFTS ANGIOGRAPHY;  Surgeon: Nigel Mormon, MD;  Location: [facility];  Service: Cardiovascular;  Laterality: N/A;  . PACEMAKER INSERTION  02/19/10   by Greggory Brandy for complete heart block    Social History   Socioeconomic History  . Marital status: Divorced    Spouse name: Not on file  . Number of children: Not on file  . Years of education: Not on file  . Highest education level: Not on file  Occupational History  . Not on file  Social Needs  . Financial resource strain: Not on file  . Food insecurity:    Worry: Not on file    Inability: Not on file  . Transportation needs:    Medical: Not on file    Non-medical: Not on file  Tobacco Use  . Smoking status: Never Smoker  . Smokeless tobacco: Never Used  Substance and Sexual Activity  . Alcohol use: Yes  . Drug use: No  . Sexual activity: Not on file  Lifestyle  . Physical activity:    Days per week: Not on file    Minutes per session: Not on file  . Stress: Not on file  Relationships  . Social connections:    Talks on phone: Not on file    Gets together: Not on file    Attends religious service: Not on  file    Active member of club or organization: Not on file    Attends meetings of clubs or organizations: Not on file    Relationship status: Not on file  . Intimate partner violence:    Fear of current or ex partner: Not on file    Emotionally abused: Not on file    Physically abused: Not on file    Forced sexual activity: Not on file  Other Topics Concern  . Not on file  Social History Narrative  . Not on file    Current Outpatient Medications on File Prior to Visit  Medication Sig Dispense Refill  . ALPRAZolam (XANAX) 1 MG tablet Take 1 mg daily as needed by mouth for anxiety.    Marland Kitchen apixaban (ELIQUIS) 5 MG TABS tablet Take 1 tablet (5 mg total) by mouth 2 (two) times daily. 60 tablet 6   . Ascorbic Acid (VITAMIN C PO) Take 1 capsule daily by mouth.    Marland Kitchen atorvastatin (LIPITOR) 40 MG tablet Take 40 mg daily by mouth.    . brimonidine (ALPHAGAN) 0.2 % ophthalmic solution Place 1 drop into both eyes 2 (two) times daily.    . clopidogrel (PLAVIX) 75 MG tablet Take 1 tablet (75 mg total) by mouth daily. 90 tablet 3  . Cyanocobalamin (VITAMIN B-12 PO) Take 1 tablet daily by mouth.    . ferrous sulfate 325 (65 FE) MG tablet Take 325 mg daily with breakfast by mouth.    . losartan (COZAAR) 50 MG tablet Take 50 mg daily by mouth.    . metoprolol tartrate (LOPRESSOR) 50 MG tablet Take 50 mg 2 (two) times daily by mouth.    . Multiple Vitamin (MULTIVITAMIN) tablet Take 1 tablet by mouth daily.      . nitroGLYCERIN (NITROSTAT) 0.4 MG SL tablet Place 1 tablet (0.4 mg total) under the tongue every 5 (five) minutes x 3 doses as needed for chest pain. 30 tablet 3  . Nutritional Supplements (COMPLEAT PO) Take 1 tablet by mouth daily.    Marland Kitchen omeprazole (PRILOSEC) 40 MG capsule Take 40 mg daily by mouth.    . Potassium (POTASSIMIN PO) Take by mouth.    . Travoprost, BAK Free, (TRAVATAN Z) 0.004 % SOLN ophthalmic solution Place 1 drop into both eyes at bedtime.     No current facility-administered medications on file prior to visit.    Review of Systems  Constitutional: Negative for malaise/fatigue and weight loss.  HENT: Positive for hearing loss.   Respiratory: Negative for cough, hemoptysis and shortness of breath.   Cardiovascular: Negative for chest pain, palpitations, claudication and leg swelling.  Gastrointestinal: Negative for abdominal pain, blood in stool, constipation, heartburn and vomiting.  Genitourinary: Negative for dysuria.  Musculoskeletal: Negative for joint pain and myalgias.  Neurological: Negative for dizziness, focal weakness and headaches.  Endo/Heme/Allergies: Does not bruise/bleed easily.  Psychiatric/Behavioral: Negative for depression. The patient is not  nervous/anxious.   All other systems reviewed and are negative.     Objective:  Blood pressure (!) 143/73, pulse 74, height 5\' 8"  (1.727 m), weight 208 lb (94.3 kg), SpO2 96 %. Body mass index is 31.63 kg/m.  Physical Exam  Constitutional: He appears well-developed and well-nourished. No distress.  HENT:  Head: Atraumatic.  Eyes: Conjunctivae are normal.  Neck: Neck supple. No JVD present. No thyromegaly present.  Cardiovascular: Normal rate, regular rhythm and intact distal pulses. Exam reveals no gallop.  Murmur heard.  Early systolic murmur is present with a  grade of 2/6 at the upper right sternal border. Pulmonary/Chest: Effort normal and breath sounds normal.  Abdominal: Soft. Bowel sounds are normal.  Musculoskeletal: Normal range of motion.        General: No edema.  Neurological: He is alert.  Skin: Skin is warm and dry.  Psychiatric: He has a normal mood and affect.    CARDIAC STUDIES:   Hospital echocardiogram 08/13/2017:   - Left ventricle: The cavity size was normal. The estimated  ejection fraction was in the range of 50% to 55%. Abnormal septal   motion due to paced rhythm/post CABG status. Unable to evaluate  diastolic function due to paced rhythm. - Aortic valve: There was mild to moderate regurgitation. - Left atrium: The atrium was moderately dilated. - Right ventricle: The cavity size was mildly dilated. Systolic  function was normal. The estimated peak pressure was 24 mm Hg.   Pacemaker lead seen. - Right atrium: The atrium was moderately dilated. Pacemaker lead  seen. - Tricuspid valve: There was mild-moderate regurgitation.  Lexiscan myoview stress test 05/19/2015: 1. Resting EKG demonstrates sinus rhythm with first-degree AV block and ventricularly paced rhythm.  Stress EKG is nondiagnostic for ischemia due to paced rhythm.  Stress symptoms included dyspnea. 2. The left ventricle is mildly dilated both rest and stress images at 155 mL.   Myocardial  perfusion imaging is normal. Overall left ventricular systolic function was normal without regional wall motion abnormalities. The left ventricular ejection fraction was 52%.  Assessment & Recommendations:   1. Coronary artery disease of native artery of native heart with stable angina pectoris [facility])  Coronary Angiography 08/13/2017: (CABG 02/15/10 LIMA to LAD and SVG to D1 by Dr. Roxy Manns): Patent LIMA to LAD however with very minimal flow with multiple branches. Native LAD focal 80% mid stenosis followed by diffuse stenosis both proximally and distally to the high-grade stenosis. SVG to D1 patent. Circumflex and right coronary normal. S/P 3.0 x 15 mm Xience Sierra stenting to mid LAD.  2. Paroxysmal atrial fibrillation (HCC) CHA2DS2-VASc Score is 4.  -(CHF; HTN; vasc disease DM,  Male = 1; Age <65 =0; 65-74 = 1,  >75 =2; stroke = 2).  -(Yearly risk of stroke: Score of 1=1.3; 2=2.2; 3=3.2; 4=4; 5=6.7; 6=9.8; 7=>9.8)   3. Atrioventricular block, complete ([place])  EKG 04/30/2018: AV paced rhythm, PVC.  No further analysis.  4. S/P placement of cardiac pacemaker Pacemaker implant 02/20/10 Medtronic  Adapta L Dual Chamber pacemaker model [id number] for complete heart block.  Remote Pacemaker transmission 04/07/2018: 2.18.20: 1st doc for AF. There were 53 atrial high rate episodes detected. The longest lasted >96 hrs in duration. There was a 9.8 % cumulative atrial arrhythmia burden. EGMs show AT/AF/AFL/VP. There were 10 high ventricular rate episodes detected. EGMs suggest nsVTs for 6-19 bts. Health trends do not demonstrate significant abnormality. Battery longevity is 3-6.5 years. RA pacing is 14.9 %, RV pacing is 95.3 %.  Scheduled In Person Pacemaker Check 08/19/2017:  Battery life > 5 years. AP < 2%, VP >20%. Mode changed from AAI-MVP mode to DDD due to prolonged PR and non-conducted beats. Base rate increased from 55 to 60/min. Normal threshold and impedance. No mode switches.  5. CKD  (chronic kidney disease) stage 3, GFR 30-59 ml/min (HCC)  6. Hypercholesterolemia  CMP     Component Value Date/Time   NA 141 04/21/2018 1235   K 4.9 04/21/2018 1235   CL 106 04/21/2018 1235   CO2 21 04/21/2018 1235   GLUCOSE  114 (H) 04/21/2018 1235   GLUCOSE 108 (H) 08/14/2017 0856   BUN 26 04/21/2018 1235   CREATININE 1.26 04/21/2018 1235   CALCIUM 9.6 04/21/2018 1235   PROT 7.1 04/21/2018 1235   ALBUMIN 4.2 04/21/2018 1235   AST 33 04/21/2018 1235   ALT 28 04/21/2018 1235   ALKPHOS 111 04/21/2018 1235   BILITOT 0.7 04/21/2018 1235   GFRNONAA 52 (L) 04/21/2018 1235   GFRAA 61 04/21/2018 1235   CBC    Component Value Date/Time   WBC 6.6 04/21/2018 1235   WBC 7.1 08/14/2017 0856   RBC 4.21 04/21/2018 1235   RBC 4.54 08/14/2017 0856   HGB 13.0 04/21/2018 1235   HCT 39.3 04/21/2018 1235   PLT 133 (L) 04/21/2018 1235   MCV 93 04/21/2018 1235   MCH 30.9 04/21/2018 1235   MCH 30.0 08/14/2017 0856   MCHC 33.1 04/21/2018 1235   MCHC 31.0 08/14/2017 0856   RDW 13.5 04/21/2018 1235   LYMPHSABS 1.5 04/21/2018 1235   MONOABS 0.7 08/14/2017 0856   EOSABS 0.1 04/21/2018 1235   BASOSABS 0.0 04/21/2018 1235   Lipid Panel     Component Value Date/Time   CHOL 106 04/21/2018 1235   TRIG 61 04/21/2018 1235   HDL 55 04/21/2018 1235   CHOLHDL 2.6 08/13/2017 0025   VLDL 16 08/13/2017 0025   LDLCALC 39 04/21/2018 1235   Prostate Specific Ag, Serum 0.0 - 4.0 ng/mL <0.1    Recommendation:  Patient is here on a three-month office visit and follow-up of diastolic heart failure, coronary artery disease and sick sinus syndrome and AV block.  He also has known coronary disease. He is presently doing well and he is wondering why he should be on Eliquis.  I have again extensively discussed with him and his daughter-in-law about his risk of cardioembolic stroke and need for anticoagulation.  Also in view of mildly reduced platelet count, I have discontinued aspirin and his CAD has been  stable. I reviewed his labs, lipids are excellent control, he does have mild hyperglycemia but no diabetes mellitus.  We'll continue pacemaker remote interrogation, pacemaker functioning normally.  I'll see him back in 6 months.  Adrian Prows, MD, [facility] 05/01/2018, 6:56 AM [facility]. [place] Pager: [phone] Office: [phone] If no answer Cell [phone]

## 2018-05-24 ENCOUNTER — Telehealth: Payer: Self-pay

## 2018-05-24 NOTE — Telephone Encounter (Signed)
Best to ask Dr. Rex Kras to write a Rx. Multiple people writing will only create alert and also he is on pretty high dose at 1 mg and I am concerned about that. Let's know how it goes with PCP

## 2018-05-24 NOTE — Telephone Encounter (Signed)
Pt called and wanted to know if you would write him a prescription for Xanax.

## 2018-05-26 ENCOUNTER — Emergency Department (HOSPITAL_COMMUNITY): Payer: Medicare Other

## 2018-05-26 ENCOUNTER — Inpatient Hospital Stay (HOSPITAL_COMMUNITY)
Admission: EM | Admit: 2018-05-26 | Discharge: 2018-06-25 | DRG: 682 | Disposition: E | Payer: Medicare Other | Attending: Internal Medicine | Admitting: Internal Medicine

## 2018-05-26 ENCOUNTER — Encounter (HOSPITAL_COMMUNITY): Payer: Self-pay | Admitting: Emergency Medicine

## 2018-05-26 ENCOUNTER — Other Ambulatory Visit: Payer: Self-pay

## 2018-05-26 DIAGNOSIS — G9340 Encephalopathy, unspecified: Secondary | ICD-10-CM | POA: Diagnosis not present

## 2018-05-26 DIAGNOSIS — R778 Other specified abnormalities of plasma proteins: Secondary | ICD-10-CM | POA: Diagnosis present

## 2018-05-26 DIAGNOSIS — N3000 Acute cystitis without hematuria: Secondary | ICD-10-CM | POA: Diagnosis present

## 2018-05-26 DIAGNOSIS — I442 Atrioventricular block, complete: Secondary | ICD-10-CM | POA: Diagnosis present

## 2018-05-26 DIAGNOSIS — Z1629 Resistance to other single specified antibiotic: Secondary | ICD-10-CM | POA: Diagnosis present

## 2018-05-26 DIAGNOSIS — E7800 Pure hypercholesterolemia, unspecified: Secondary | ICD-10-CM | POA: Diagnosis present

## 2018-05-26 DIAGNOSIS — I5033 Acute on chronic diastolic (congestive) heart failure: Secondary | ICD-10-CM | POA: Diagnosis present

## 2018-05-26 DIAGNOSIS — N179 Acute kidney failure, unspecified: Principal | ICD-10-CM

## 2018-05-26 DIAGNOSIS — Z7902 Long term (current) use of antithrombotics/antiplatelets: Secondary | ICD-10-CM

## 2018-05-26 DIAGNOSIS — F419 Anxiety disorder, unspecified: Secondary | ICD-10-CM | POA: Diagnosis present

## 2018-05-26 DIAGNOSIS — I480 Paroxysmal atrial fibrillation: Secondary | ICD-10-CM | POA: Diagnosis present

## 2018-05-26 DIAGNOSIS — Z955 Presence of coronary angioplasty implant and graft: Secondary | ICD-10-CM

## 2018-05-26 DIAGNOSIS — Z79899 Other long term (current) drug therapy: Secondary | ICD-10-CM

## 2018-05-26 DIAGNOSIS — K644 Residual hemorrhoidal skin tags: Secondary | ICD-10-CM | POA: Diagnosis present

## 2018-05-26 DIAGNOSIS — N183 Chronic kidney disease, stage 3 (moderate): Secondary | ICD-10-CM | POA: Diagnosis present

## 2018-05-26 DIAGNOSIS — R410 Disorientation, unspecified: Secondary | ICD-10-CM

## 2018-05-26 DIAGNOSIS — I2510 Atherosclerotic heart disease of native coronary artery without angina pectoris: Secondary | ICD-10-CM | POA: Diagnosis present

## 2018-05-26 DIAGNOSIS — Z8249 Family history of ischemic heart disease and other diseases of the circulatory system: Secondary | ICD-10-CM

## 2018-05-26 DIAGNOSIS — Z8546 Personal history of malignant neoplasm of prostate: Secondary | ICD-10-CM

## 2018-05-26 DIAGNOSIS — Z7901 Long term (current) use of anticoagulants: Secondary | ICD-10-CM

## 2018-05-26 DIAGNOSIS — Z7189 Other specified counseling: Secondary | ICD-10-CM

## 2018-05-26 DIAGNOSIS — I248 Other forms of acute ischemic heart disease: Secondary | ICD-10-CM | POA: Diagnosis present

## 2018-05-26 DIAGNOSIS — R9401 Abnormal electroencephalogram [EEG]: Secondary | ICD-10-CM | POA: Diagnosis present

## 2018-05-26 DIAGNOSIS — R0602 Shortness of breath: Secondary | ICD-10-CM

## 2018-05-26 DIAGNOSIS — Z515 Encounter for palliative care: Secondary | ICD-10-CM

## 2018-05-26 DIAGNOSIS — G9341 Metabolic encephalopathy: Secondary | ICD-10-CM | POA: Diagnosis present

## 2018-05-26 DIAGNOSIS — Z781 Physical restraint status: Secondary | ICD-10-CM

## 2018-05-26 DIAGNOSIS — Z1611 Resistance to penicillins: Secondary | ICD-10-CM | POA: Diagnosis present

## 2018-05-26 DIAGNOSIS — N3001 Acute cystitis with hematuria: Secondary | ICD-10-CM

## 2018-05-26 DIAGNOSIS — D696 Thrombocytopenia, unspecified: Secondary | ICD-10-CM | POA: Diagnosis present

## 2018-05-26 DIAGNOSIS — B961 Klebsiella pneumoniae [K. pneumoniae] as the cause of diseases classified elsewhere: Secondary | ICD-10-CM | POA: Diagnosis present

## 2018-05-26 DIAGNOSIS — I10 Essential (primary) hypertension: Secondary | ICD-10-CM | POA: Diagnosis present

## 2018-05-26 DIAGNOSIS — T465X5A Adverse effect of other antihypertensive drugs, initial encounter: Secondary | ICD-10-CM | POA: Diagnosis present

## 2018-05-26 DIAGNOSIS — K921 Melena: Secondary | ICD-10-CM | POA: Diagnosis present

## 2018-05-26 DIAGNOSIS — R7989 Other specified abnormal findings of blood chemistry: Secondary | ICD-10-CM | POA: Diagnosis present

## 2018-05-26 DIAGNOSIS — F0390 Unspecified dementia without behavioral disturbance: Secondary | ICD-10-CM | POA: Diagnosis present

## 2018-05-26 DIAGNOSIS — F05 Delirium due to known physiological condition: Secondary | ICD-10-CM | POA: Diagnosis present

## 2018-05-26 DIAGNOSIS — Z950 Presence of cardiac pacemaker: Secondary | ICD-10-CM

## 2018-05-26 DIAGNOSIS — E860 Dehydration: Secondary | ICD-10-CM | POA: Diagnosis present

## 2018-05-26 DIAGNOSIS — I130 Hypertensive heart and chronic kidney disease with heart failure and stage 1 through stage 4 chronic kidney disease, or unspecified chronic kidney disease: Secondary | ICD-10-CM | POA: Diagnosis present

## 2018-05-26 DIAGNOSIS — Z951 Presence of aortocoronary bypass graft: Secondary | ICD-10-CM

## 2018-05-26 LAB — CBC WITH DIFFERENTIAL/PLATELET
Abs Immature Granulocytes: 0.02 10*3/uL (ref 0.00–0.07)
Basophils Absolute: 0 10*3/uL (ref 0.0–0.1)
Basophils Relative: 0 %
Eosinophils Absolute: 0.1 10*3/uL (ref 0.0–0.5)
Eosinophils Relative: 1 %
HCT: 40.3 % (ref 39.0–52.0)
Hemoglobin: 12.3 g/dL — ABNORMAL LOW (ref 13.0–17.0)
Immature Granulocytes: 0 %
Lymphocytes Relative: 12 %
Lymphs Abs: 1.1 10*3/uL (ref 0.7–4.0)
MCH: 29.4 pg (ref 26.0–34.0)
MCHC: 30.5 g/dL (ref 30.0–36.0)
MCV: 96.2 fL (ref 80.0–100.0)
Monocytes Absolute: 0.7 10*3/uL (ref 0.1–1.0)
Monocytes Relative: 8 %
Neutro Abs: 7 10*3/uL (ref 1.7–7.7)
Neutrophils Relative %: 79 %
Platelets: 159 10*3/uL (ref 150–400)
RBC: 4.19 MIL/uL — ABNORMAL LOW (ref 4.22–5.81)
RDW: 17.2 % — ABNORMAL HIGH (ref 11.5–15.5)
WBC: 8.8 10*3/uL (ref 4.0–10.5)
nRBC: 0.6 % — ABNORMAL HIGH (ref 0.0–0.2)

## 2018-05-26 LAB — URINALYSIS, MICROSCOPIC (REFLEX): WBC, UA: >50 WBC/hpf (ref 0–5)

## 2018-05-26 LAB — COMPREHENSIVE METABOLIC PANEL
ALT: 43 U/L (ref 0–44)
AST: 69 U/L — ABNORMAL HIGH (ref 15–41)
Albumin: 3.3 g/dL — ABNORMAL LOW (ref 3.5–5.0)
Alkaline Phosphatase: 99 U/L (ref 38–126)
Anion gap: 9 (ref 5–15)
BUN: 62 mg/dL — ABNORMAL HIGH (ref 8–23)
CO2: 18 mmol/L — ABNORMAL LOW (ref 22–32)
Calcium: 9 mg/dL (ref 8.9–10.3)
Chloride: 113 mmol/L — ABNORMAL HIGH (ref 98–111)
Creatinine, Ser: 2.28 mg/dL — ABNORMAL HIGH (ref 0.61–1.24)
GFR calc Af Amer: 29 mL/min — ABNORMAL LOW (ref >60)
GFR calc non Af Amer: 25 mL/min — ABNORMAL LOW (ref >60)
Glucose, Bld: 85 mg/dL (ref 70–99)
Potassium: 4.8 mmol/L (ref 3.5–5.1)
Sodium: 140 mmol/L (ref 135–145)
Total Bilirubin: 1.9 mg/dL — ABNORMAL HIGH (ref 0.3–1.2)
Total Protein: 6.8 g/dL (ref 6.5–8.1)

## 2018-05-26 LAB — TYPE AND SCREEN
ABO/RH(D): A POS
Antibody Screen: NEGATIVE

## 2018-05-26 LAB — URINALYSIS, ROUTINE W REFLEX MICROSCOPIC
Bilirubin Urine: NEGATIVE
Glucose, UA: NEGATIVE mg/dL
Ketones, ur: 15 mg/dL — AB
Nitrite: NEGATIVE
Protein, ur: 30 mg/dL — AB
Specific Gravity, Urine: 1.015 (ref 1.005–1.030)
pH: 6 (ref 5.0–8.0)

## 2018-05-26 LAB — POC OCCULT BLOOD, ED: Fecal Occult Bld: NEGATIVE

## 2018-05-26 LAB — TROPONIN I
Troponin I: 0.06 ng/mL (ref <0.03)
Troponin I: 0.05 ng/mL (ref <0.03)

## 2018-05-26 LAB — PROTIME-INR
INR: 2.8 — ABNORMAL HIGH (ref 0.8–1.2)
Prothrombin Time: 28.8 seconds — ABNORMAL HIGH (ref 11.4–15.2)

## 2018-05-26 LAB — BRAIN NATRIURETIC PEPTIDE: B Natriuretic Peptide: 1057.8 pg/mL — ABNORMAL HIGH (ref 0.0–100.0)

## 2018-05-26 MED ORDER — POLYETHYLENE GLYCOL 3350 17 G PO PACK: 17.0000 g | PACK | Freq: Every day | ORAL | Status: DC | PRN

## 2018-05-26 MED ORDER — SODIUM CHLORIDE 0.9 % IV BOLUS
1000.0000 mL | Freq: Once | INTRAVENOUS | Status: AC
  Administered 2018-05-26: 13:00:00 1000 mL via INTRAVENOUS

## 2018-05-26 MED ORDER — DORZOLAMIDE HCL 2 % OP SOLN
1.0000 [drp] | Freq: Two times a day (BID) | OPHTHALMIC | Status: DC
  Administered 2018-05-26 – 2018-06-03 (×16): 1 [drp] via OPHTHALMIC
  Filled 2018-05-26: qty 10

## 2018-05-26 MED ORDER — METOPROLOL TARTRATE 50 MG PO TABS
50.0000 mg | ORAL_TABLET | Freq: Two times a day (BID) | ORAL | Status: DC
  Administered 2018-05-26 – 2018-06-01 (×13): 50 mg via ORAL
  Filled 2018-05-26 (×13): qty 1

## 2018-05-26 MED ORDER — ACETAMINOPHEN 325 MG PO TABS: 650.0000 mg | ORAL_TABLET | Freq: Four times a day (QID) | ORAL | Status: DC | PRN

## 2018-05-26 MED ORDER — LACTATED RINGERS IV SOLN
INTRAVENOUS | Status: DC
  Administered 2018-05-26 – 2018-05-27 (×2): via INTRAVENOUS

## 2018-05-26 MED ORDER — SODIUM CHLORIDE 0.9 % IV SOLN
1.0000 g | Freq: Once | INTRAVENOUS | Status: AC
  Administered 2018-05-26: 16:00:00 1 g via INTRAVENOUS
  Filled 2018-05-26: qty 10

## 2018-05-26 MED ORDER — PANTOPRAZOLE SODIUM 40 MG PO TBEC
40.0000 mg | DELAYED_RELEASE_TABLET | Freq: Every day | ORAL | Status: DC
  Administered 2018-05-26 – 2018-06-03 (×9): 40 mg via ORAL
  Filled 2018-05-26 (×9): qty 1

## 2018-05-26 MED ORDER — LATANOPROST 0.005 % OP SOLN
1.0000 [drp] | Freq: Every day | OPHTHALMIC | Status: DC
  Administered 2018-05-26 – 2018-06-02 (×7): 1 [drp] via OPHTHALMIC
  Filled 2018-05-26 (×2): qty 2.5

## 2018-05-26 MED ORDER — ONDANSETRON HCL 4 MG/2ML IJ SOLN: 4.0000 mg | Freq: Four times a day (QID) | INTRAMUSCULAR | Status: DC | PRN

## 2018-05-26 MED ORDER — APIXABAN 2.5 MG PO TABS
2.5000 mg | ORAL_TABLET | Freq: Two times a day (BID) | ORAL | Status: DC
  Administered 2018-05-26 – 2018-06-03 (×17): 2.5 mg via ORAL
  Filled 2018-05-26 (×17): qty 1

## 2018-05-26 MED ORDER — FERROUS SULFATE 325 (65 FE) MG PO TABS
325.0000 mg | ORAL_TABLET | Freq: Every day | ORAL | Status: DC
  Administered 2018-05-27 – 2018-06-03 (×8): 325 mg via ORAL
  Filled 2018-05-26 (×8): qty 1

## 2018-05-26 MED ORDER — ONDANSETRON HCL 4 MG PO TABS: 4.0000 mg | ORAL_TABLET | Freq: Four times a day (QID) | ORAL | Status: DC | PRN

## 2018-05-26 MED ORDER — NITROGLYCERIN 0.4 MG SL SUBL: 0.4000 mg | SUBLINGUAL_TABLET | SUBLINGUAL | Status: DC | PRN

## 2018-05-26 MED ORDER — ACETAMINOPHEN 650 MG RE SUPP: 650.0000 mg | Freq: Four times a day (QID) | RECTAL | Status: DC | PRN

## 2018-05-26 MED ORDER — ATORVASTATIN CALCIUM 40 MG PO TABS
40.0000 mg | ORAL_TABLET | Freq: Every day | ORAL | Status: DC
  Administered 2018-05-26 – 2018-06-03 (×9): 40 mg via ORAL
  Filled 2018-05-26 (×9): qty 1

## 2018-05-26 MED ORDER — SODIUM CHLORIDE 0.9 % IV SOLN
1.0000 g | INTRAVENOUS | Status: DC
  Administered 2018-05-27 – 2018-05-28 (×2): 1 g via INTRAVENOUS
  Filled 2018-05-26 (×2): qty 10

## 2018-05-26 NOTE — ED Notes (Signed)
Patient transported to X-ray 

## 2018-05-26 NOTE — ED Notes (Signed)
Critical lab result read back to [facility])

## 2018-05-26 NOTE — ED Notes (Signed)
IV team at bedside 

## 2018-05-26 NOTE — Progress Notes (Signed)
James Harvey is a 85 y.o. male patient admitted from ED awake, alert - oriented  X 2 - no acute distress noted.  VSS - Blood pressure 125/65, pulse 64, temperature 98.1 F (36.7 C), temperature source Axillary, resp. rate (!) 27, SpO2 99 %.    IV in place, occlusive dsg intact without redness.  Orientation to room, and floor completed with information packet given to patient. Admission INP armband ID verified with patient and in place.  Fall assessment complete, with patient able to verbalize understanding of risk associated with falls, and verbalized understanding to call nsg before up out of bed.  Call light within reach, patient able to voice, and demonstrate understanding.  Skin, clean-dry- intact.   No evidence of skin break down noted on exam.     Will cont to eval and treat per MD orders.  Patience Musca, RN 06/12/2018 5:47 PM

## 2018-05-26 NOTE — ED Notes (Signed)
This RN attempted an IV x3 and was unsuccessful, IV team order placed.

## 2018-05-26 NOTE — ED Notes (Addendum)
ED TO INPATIENT HANDOFF REPORT  ED Nurse Name and Phone #:  Lonn Georgia [phone]  S Name/Age/Gender James Harvey 85 y.o. male Room/Bed: 294T/654Y  Code Status   Code Status: Full Code  Home/SNF/Other Home Patient oriented to: self Is this baseline? No   Triage Complete: Triage complete  Chief Complaint bleeding in gential area  Triage Note Family Contacts-  [name] (daughter in law) [phone] [name] (son) [phone]  Pt brought in by family, they report rectal bleeding, moderate bright red amount noted in underwear. Pt unable to give further info.    Allergies No Known Allergies  Level of Care/Admitting Diagnosis ED Disposition    ED Disposition Condition [facility] Area: [place] [[id number]]  Level of Care: Med-Surg [16]  I expect the patient will be discharged within 24 hours: Yes  LOW acuity---Tx typically complete <24 hrs---ACUTE conditions typically can be evaluated <24 hours---LABS likely to return to acceptable levels <24 hours---IS near functional baseline---EXPECTED to return to current living arrangement---NOT newly hypoxic: Meets criteria for 5C-Observation unit  Diagnosis: Acute cystitis [595.0.ICD-9-CM]  Admitting Physician: Shelda Pal [[id number]]  Attending Physician: Shelda Pal [phone]  PT Class (Do Not Modify): Observation [104]  PT Acc Code (Do Not Modify): Observation [10022]       B Medical/Surgery History Past Medical History:  Diagnosis Date  . Anxiety   . Atrioventricular block, complete ([place]) 02/19/10   s/p PPM by JA  . CAD (coronary artery disease)    s/p CABG 02/15/2010 (single vessel)  . CKD (chronic kidney disease) stage 3, GFR 30-59 ml/min ([place]) 04/20/2018  . Encounter for care of pacemaker 04/30/2018  . Hypercholesterolemia 04/20/2018  . Hyperglycemia 04/20/2018  . Hypertension   . Prostate cancer ([place])   . Unstable angina ([place]) 08/12/2017   Past Surgical History:   Procedure Laterality Date  . CORONARY STENT INTERVENTION N/A 08/13/2017   Procedure: CORONARY STENT INTERVENTION;  Surgeon: Nigel Mormon, MD;  Location: [facility];  Service: Cardiovascular;  Laterality: N/A;  . LEFT HEART CATH AND CORS/GRAFTS ANGIOGRAPHY N/A 08/13/2017   Procedure: LEFT HEART CATH AND CORS/GRAFTS ANGIOGRAPHY;  Surgeon: Nigel Mormon, MD;  Location: [facility];  Service: Cardiovascular;  Laterality: N/A;  . PACEMAKER INSERTION  02/19/10   by Greggory Brandy for complete heart block     A IV Location/Drains/Wounds Patient Lines/Drains/Airways Status   Active Line/Drains/Airways    Name:   Placement date:   Placement time:   Site:   Days:   Peripheral IV 06/05/2018 Left;Anterior Forearm   06/01/2018    1305    Forearm   less than 1   Wound / Incision (Open or Dehisced) 01/06/17 Laceration Head Upper;Left   01/06/17    0200    Head   505          Intake/Output Last 24 hours No intake or output data in the 24 hours ending 06/02/2018 1647  Labs/Imaging Results for orders placed or performed during the hospital encounter of 06/21/2018 (from the past 48 hour(s))  CBC with Differential/Platelet     Status: Abnormal   Collection Time: 06/16/2018 12:15 PM  Result Value Ref Range   WBC 8.8 4.0 - 10.5 K/uL   RBC 4.19 (L) 4.22 - 5.81 MIL/uL   Hemoglobin 12.3 (L) 13.0 - 17.0 g/dL   HCT 40.3 39.0 - 52.0 %   MCV 96.2 80.0 - 100.0 fL   MCH 29.4 26.0 - 34.0 pg  MCHC 30.5 30.0 - 36.0 g/dL   RDW 17.2 (H) 11.5 - 15.5 %   Platelets 159 150 - 400 K/uL   nRBC 0.6 (H) 0.0 - 0.2 %   Neutrophils Relative % 79 %   Neutro Abs 7.0 1.7 - 7.7 K/uL   Lymphocytes Relative 12 %   Lymphs Abs 1.1 0.7 - 4.0 K/uL   Monocytes Relative 8 %   Monocytes Absolute 0.7 0.1 - 1.0 K/uL   Eosinophils Relative 1 %   Eosinophils Absolute 0.1 0.0 - 0.5 K/uL   Basophils Relative 0 %   Basophils Absolute 0.0 0.0 - 0.1 K/uL   Immature Granulocytes 0 %   Abs Immature Granulocytes 0.02 0.00 - 0.07  K/uL    Comment: Performed at [facility] [address], [place], [place] [address]  Comprehensive metabolic panel     Status: Abnormal   Collection Time: 05/31/2018 12:15 PM  Result Value Ref Range   Sodium 140 135 - 145 mmol/L   Potassium 4.8 3.5 - 5.1 mmol/L   Chloride 113 (H) 98 - 111 mmol/L   CO2 18 (L) 22 - 32 mmol/L   Glucose, Bld 85 70 - 99 mg/dL   BUN 62 (H) 8 - 23 mg/dL   Creatinine, Ser 2.28 (H) 0.61 - 1.24 mg/dL   Calcium 9.0 8.9 - 10.3 mg/dL   Total Protein 6.8 6.5 - 8.1 g/dL   Albumin 3.3 (L) 3.5 - 5.0 g/dL   AST 69 (H) 15 - 41 U/L   ALT 43 0 - 44 U/L   Alkaline Phosphatase 99 38 - 126 U/L   Total Bilirubin 1.9 (H) 0.3 - 1.2 mg/dL   GFR calc non Af Amer 25 (L) >60 mL/min   GFR calc Af Amer 29 (L) >60 mL/min   Anion gap 9 5 - 15    Comment: Performed at [facility], [place] [address] [place], [place] [address]  Protime-INR     Status: Abnormal   Collection Time: 06/22/2018 12:15 PM  Result Value Ref Range   Prothrombin Time 28.8 (H) 11.4 - 15.2 seconds   INR 2.8 (H) 0.8 - 1.2    Comment: (NOTE) INR goal varies based on device and disease states. Performed at [facility], [place] [address] [place], [place] [address]   Type and screen     Status: None   Collection Time: 06/21/2018 12:15 PM  Result Value Ref Range   ABO/RH(D) A POS    Antibody Screen NEG    Sample Expiration      05/29/2018 Performed at [facility], [place] [address], [place], [place] [address]   Troponin I - ONCE - STAT     Status: Abnormal   Collection Time: 06/06/2018 12:15 PM  Result Value Ref Range   Troponin I 0.06 (HH) <0.03 ng/mL    Comment: CRITICAL RESULT CALLED TO, READ BACK BY AND VERIFIED WITH: P GREEN RN [phone] BY A BENNETT Performed at [facility], [place] [address], [place], [place] [address]   POC occult blood, ED     Status: None   Collection Time: 05/31/2018 12:21 PM  Result Value Ref Range   Fecal Occult Bld NEGATIVE NEGATIVE  Urinalysis,  Routine w reflex microscopic     Status: Abnormal   Collection Time: 06/03/2018  2:32 PM  Result Value Ref Range   Color, Urine YELLOW YELLOW   APPearance CLOUDY (A) CLEAR   Specific Gravity, Urine  1.015 1.005 - 1.030   pH 6.0 5.0 - 8.0   Glucose, UA NEGATIVE NEGATIVE mg/dL   Hgb urine dipstick SMALL (A) NEGATIVE   Bilirubin Urine NEGATIVE NEGATIVE   Ketones, ur 15 (A) NEGATIVE mg/dL   Protein, ur 30 (A) NEGATIVE mg/dL   Nitrite NEGATIVE NEGATIVE   Leukocytes,Ua MODERATE (A) NEGATIVE    Comment: Performed at [facility] [address], [place], [place] [address]  Urinalysis, Microscopic (reflex)     Status: Abnormal   Collection Time: 06/19/2018  2:32 PM  Result Value Ref Range   RBC / HPF 0-5 0 - 5 RBC/hpf   WBC, UA >50 0 - 5 WBC/hpf   Bacteria, UA MANY (A) NONE SEEN   Squamous Epithelial / LPF 0-5 0 - 5   Non Squamous Epithelial PRESENT (A) NONE SEEN   WBC Clumps PRESENT     Comment: Performed at [facility], [place] [address] [place], [place] [address]   Dg Chest 2 View  Result Date: 06/20/2018 CLINICAL DATA:  Loss of consciousness, no chest symptoms EXAM: CHEST - 2 VIEW COMPARISON:  08/12/2017 FINDINGS: Bilateral diffuse interstitial thickening. Small bilateral pleural effusions. No pneumothorax. Stable cardiomegaly. Prior CABG. Dual lead cardiac pacemaker. No acute osseous abnormality. IMPRESSION: Findings consistent with mild CHF. Electronically Signed   By: Kathreen Devoid   On: 06/08/2018 14:39   Ct Head Wo Contrast  Result Date: 06/22/2018 CLINICAL DATA:  Altered level of consciousness. EXAM: CT HEAD WITHOUT CONTRAST TECHNIQUE: Contiguous axial images were obtained from the base of the skull through the vertex without intravenous contrast. COMPARISON:  CT scan of August 14, 2017. FINDINGS: Brain: Mild diffuse cortical atrophy is noted. Mild chronic ischemic white matter disease is noted. No mass effect or midline shift is noted. Ventricular size is within normal limits.  There is no evidence of mass lesion, hemorrhage or acute infarction. Vascular: No hyperdense vessel or unexpected calcification. Skull: Normal. Negative for fracture or focal lesion. Sinuses/Orbits: No acute finding. Other: None. IMPRESSION: Mild diffuse cortical atrophy. Mild chronic ischemic white matter disease. No acute intracranial abnormality seen. Electronically Signed   By: Marijo Conception, M.D.   On: 06/10/2018 14:18   Ct Renal Stone Study  Result Date: 06/16/2018 CLINICAL DATA:  Left lower quadrant pain. EXAM: CT ABDOMEN AND PELVIS WITHOUT CONTRAST TECHNIQUE: Multidetector CT imaging of the abdomen and pelvis was performed following the standard protocol without IV contrast. COMPARISON:  CT, 01/05/2017 FINDINGS: Lower chest: Mild cardiomegaly. Small right pleural effusion. Vascular prominence and mild interstitial thickening at the lung bases. Mild dependent subsegmental atelectasis, right lower lobe. Hepatobiliary: Liver normal in size. No mass or focal lesion. Dependent gallstones and a mostly decompressed gallbladder. No bile duct dilation. Pancreas: Unremarkable. No pancreatic ductal dilatation or surrounding inflammatory changes. Spleen: Normal in size without focal abnormality. Adrenals/Urinary Tract: No adrenal masses. Moderate to severe right renal atrophy. Left kidney normal in size. Both kidneys normal in position. Subcentimeter hyperattenuating mass projects posteriorly from the right kidney pole, consistent with a mildly complicated cyst, stable from the prior CT. No other renal masses, no stones and no hydronephrosis. Normal ureters. Bladder is mildly distended. No bladder wall thickening. There is air that tracks along the lower anterior bladder wall, which may reside within the wall. Stomach/Bowel: Stomach is unremarkable. Small bowel is normal in caliber. No wall thickening or evidence of inflammation. Colon is normal caliber. There are numerous diverticula most evident along the sigmoid  colon. No  evidence of diverticulitis. No colonic wall thickening or other inflammatory process. Normal appendix visualized. Vascular/Lymphatic: Mild aortic atherosclerosis. No aneurysm. No enlarged lymph nodes. Reproductive: Stable changes from a prior prostatectomy and penile implant insertion. Other: Small amount of ascites collects adjacent to the liver and spleen, tracks along the pericolic gutters and extends into the pelvic recesses. No free air. Musculoskeletal: No fracture or acute finding. Degenerative changes throughout the visualized spine and arthropathic changes of both hips. No osteoblastic or osteolytic lesions. IMPRESSION: 1. Small right pleural effusion, lung base vascular prominence and interstitial thickening and mild cardiomegaly. Findings support mild congestive heart failure if there are symptoms of shortness of breath. 2. Small amount of ascites unclear etiology. 3. Small amount of air tracks along the anterior inferior aspect of the bladder, appearing to project within the bladder wall. There is no bladder wall thickening. No bladder masses. The etiology of this finding is unclear. Correlate with any history of recent bladder instrumentation. Emphysematous cystitis should be considered in the proper clinical setting, despite the lack of bladder wall thickening. 4. No other evidence of an acute abnormality. 5. Numerous colonic diverticula mostly along the sigmoid colon. No evidence of diverticulitis. 6. Moderate to marked right renal atrophy. 7. Aortic atherosclerosis. Electronically Signed   By: Lajean Manes M.D.   On: 06/21/2018 14:26    Pending Labs Unresulted Labs (From admission, onward)    Start     Ordered   05/27/18 5361  Basic metabolic panel  Tomorrow morning,   R     05/27/2018 1621   05/27/18 0500  CBC  Tomorrow morning,   R     06/03/2018 1621   06/21/2018 1447  Brain natriuretic peptide  Add-on,   R     06/15/2018 1446   06/22/2018 1436  Urine culture  ONCE - STAT,   STAT      06/03/2018 1435          Vitals/Pain Today's Vitals   06/22/2018 1230 05/31/2018 1245 06/09/2018 1315 05/29/2018 1615  BP: 121/72 130/76 117/65 (!) 141/80  Pulse:  60 65 73  Resp:   (!) 27 (!) 27  Temp:      TempSrc:      SpO2:  100% 100% 95%  PainSc:        Isolation Precautions No active isolations  Medications Medications  apixaban (ELIQUIS) tablet 5 mg (has no administration in time range)  atorvastatin (LIPITOR) tablet 40 mg (has no administration in time range)  dorzolamide (TRUSOPT) 2 % ophthalmic solution 1 drop (has no administration in time range)  ferrous sulfate tablet 325 mg (has no administration in time range)  metoprolol tartrate (LOPRESSOR) tablet 50 mg (has no administration in time range)  nitroGLYCERIN (NITROSTAT) SL tablet 0.4 mg (has no administration in time range)  pantoprazole (PROTONIX) EC tablet 40 mg (has no administration in time range)  latanoprost (XALATAN) 0.005 % ophthalmic solution 1 drop (has no administration in time range)  lactated ringers infusion (has no administration in time range)  ondansetron (ZOFRAN) tablet 4 mg (has no administration in time range)    Or  ondansetron (ZOFRAN) injection 4 mg (has no administration in time range)  polyethylene glycol (MIRALAX / GLYCOLAX) packet 17 g (has no administration in time range)  acetaminophen (TYLENOL) tablet 650 mg (has no administration in time range)    Or  acetaminophen (TYLENOL) suppository 650 mg (has no administration in time range)  cefTRIAXone (ROCEPHIN) 1 g in sodium chloride 0.9 % 100  mL IVPB (has no administration in time range)  sodium chloride 0.9 % bolus 1,000 mL (0 mLs Intravenous Stopped 06/22/2018 1454)  cefTRIAXone (ROCEPHIN) 1 g in sodium chloride 0.9 % 100 mL IVPB (1 g Intravenous New Bag/Given 06/21/2018 1606)    Mobility walks with person assist High fall risk   Focused Assessments  R Recommendations: See Admitting Provider Note  Report given to:   Additional Notes:  Pt  has been confused x 2 weeks per daughter, is normally oriented.

## 2018-05-26 NOTE — ED Triage Notes (Signed)
Family Contacts-  [name] (daughter in law) [phone] [name] (son) [phone]

## 2018-05-26 NOTE — H&P (Addendum)
History and Physical    James Harvey YJE:[id number] DOB: 03-10-1932 DOA: 06/11/2018  PCP: Tamsen Roers, MD  Patient coming from: PCP's office  Chief Complaint: AMS  HPI: James Harvey is a 85 y.o. male with medical history significant of CAD w pacemaker, A fib on Eliquis, dementia, hypertension and anxiety. Pt's son and daughter in law provide history.  Patient reports he does not know why he is here.  He did have some pain in his bladder and difficulty with urination this morning.  Denies any complaints otherwise.  Patient son states that he has a baseline of dementia.  Over the past week, this is worsened.  No urinary complaints per the son, though he did have some back pain.  They took him to his regular doctor today and blood was found in his underwear.  Denies unintentional weight loss.  Of note, he is on ferrous sulfate but the son is unsure why.  He does not believe he was diagnosed with a low blood count or low iron in the past.  While he does have a history of coronary artery disease, he does not have any chest pain or shortness of breath today.  ED Course: Chest x-ray unremarkable.  CT head shows no acute changes.  CT abdomen/pelvis shows no kidney stones or bowel etiology.  Urinalysis suggestive of infection.  Rocephin was started.  Metabolic panel shows creatinine of 2.28 with a baseline of 1.2.  Fluids were started.  Troponin elevated at baseline, thought to be demand ischemia by ER team.  EKG shows a paced rhythm without ischemia.  Review of Systems: As per HPI otherwise 10 point review of systems negative per the son as a reliable review of systems from the patient was not able to be obtained.   Past Medical History:  Diagnosis Date   Anxiety    Atrioventricular block, complete ([place]) 02/19/10   s/p PPM by JA   CAD (coronary artery disease)    s/p CABG 02/15/2010 (single vessel)   CKD (chronic kidney disease) stage 3, GFR 30-59 ml/min ([place]) 04/20/2018   Encounter for  care of pacemaker 04/30/2018   Hypercholesterolemia 04/20/2018   Hyperglycemia 04/20/2018   Hypertension    Prostate cancer ([place])    Unstable angina ([place]) 08/12/2017    Past Surgical History:  Procedure Laterality Date   CORONARY STENT INTERVENTION N/A 08/13/2017   Procedure: CORONARY STENT INTERVENTION;  Surgeon: Nigel Mormon, MD;  Location: [facility];  Service: Cardiovascular;  Laterality: N/A;   LEFT HEART CATH AND CORS/GRAFTS ANGIOGRAPHY N/A 08/13/2017   Procedure: LEFT HEART CATH AND CORS/GRAFTS ANGIOGRAPHY;  Surgeon: Nigel Mormon, MD;  Location: [facility];  Service: Cardiovascular;  Laterality: N/A;   PACEMAKER INSERTION  02/19/10   by Greggory Brandy for complete heart block     reports that he has never smoked. He has never used smokeless tobacco. He reports current alcohol use. He reports that he does not use drugs.  No Known Allergies  Family History  Problem Relation Age of Onset   Hypertension Other     Prior to Admission medications   Medication Sig Start Date End Date Taking? Authorizing Provider  ALPRAZolam Duanne Moron) 1 MG tablet Take 1 mg daily as needed by mouth for anxiety.   Yes [provider]  apixaban (ELIQUIS) 5 MG TABS tablet Take 1 tablet (5 mg total) by mouth 2 (two) times daily. 04/20/18  Yes Adrian Prows, MD  Ascorbic Acid (VITAMIN C PO) Take 1  capsule daily by mouth.   Yes [provider]  atorvastatin (LIPITOR) 40 MG tablet Take 40 mg daily by mouth.   Yes [provider]  Cyanocobalamin (VITAMIN B-12 PO) Take 1 tablet daily by mouth.   Yes [provider]  dorzolamide (TRUSOPT) 2 % ophthalmic solution Place 1 drop into both eyes 2 (two) times daily. 05/03/18 05/03/19 Yes [provider]  ferrous sulfate 325 (65 FE) MG tablet Take 325 mg daily with breakfast by mouth.   Yes [provider]  losartan (COZAAR) 50 MG tablet Take 50 mg daily by mouth.   Yes [provider]    metoprolol tartrate (LOPRESSOR) 50 MG tablet Take 50 mg 2 (two) times daily by mouth.   Yes [provider]  Multiple Vitamin (MULTIVITAMIN) tablet Take 1 tablet by mouth daily.     Yes [provider]  nitroGLYCERIN (NITROSTAT) 0.4 MG SL tablet Place 1 tablet (0.4 mg total) under the tongue every 5 (five) minutes x 3 doses as needed for chest pain. 08/14/17  Yes Patwardhan, Manish J, MD  omeprazole (PRILOSEC) 40 MG capsule Take 40 mg daily by mouth.   Yes [provider]  Travoprost, BAK Free, (TRAVATAN Z) 0.004 % SOLN ophthalmic solution Place 1 drop into both eyes at bedtime. 07/27/17  Yes [provider]  clopidogrel (PLAVIX) 75 MG tablet Take 1 tablet (75 mg total) by mouth daily. Patient not taking: Reported on 06/22/2018 08/15/17   Nigel Mormon, MD    Physical Exam: Vitals:   06/06/2018 1230 06/18/2018 1245 06/24/2018 1315 05/31/2018 1615  BP: 121/72 130/76 117/65 (!) 141/80  Pulse:  60 65 73  Resp:   (!) 27 (!) 27  Temp:      TempSrc:      SpO2:  100% 100% 95%    Constitutional: NAD, calm, comfortable Eyes: PERRL, lids and conjunctivae normal ENMT: Mucous membranes are moist. Posterior pharynx clear of any exudate or lesions.Normal dentition.  Neck: normal, supple, no masses, no thyromegaly Respiratory: clear to auscultation bilaterally, no wheezing, no crackles. Normal respiratory effort. No accessory muscle use.  Cardiovascular: RRR. 2+ pitting b/l LE edema. 2+ pedal pulses.  Abdomen: He is distended, tender to palpation in the suprapubic region, no masses palpated. No hepatosplenomegaly. Bowel sounds positive.  There is a large external hemorrhoid noted antero-laterally on the left Musculoskeletal: no clubbing / cyanosis. No joint deformity upper and lower extremities. Good ROM, no contractures. Normal muscle tone.  Skin: no rashes, lesions, ulcers.  Neurologic: CN 2-12 grossly intact. Sensation intact, DTR normal. Strength 5/5 in all 4.   Psychiatric: Normal judgment and insight. Alert and oriented x 0. Normal mood.   Labs on Admission: I have personally reviewed following labs and imaging studies  CBC: Recent Labs  Lab 06/18/2018 1215  WBC 8.8  NEUTROABS 7.0  HGB 12.3*  HCT 40.3  MCV 96.2  PLT 202   Basic Metabolic Panel: Recent Labs  Lab 06/20/2018 1215  NA 140  K 4.8  CL 113*  CO2 18*  GLUCOSE 85  BUN 62*  CREATININE 2.28*  CALCIUM 9.0   Liver Function Tests: Recent Labs  Lab 05/31/2018 1215  AST 69*  ALT 43  ALKPHOS 99  BILITOT 1.9*  PROT 6.8  ALBUMIN 3.3*   Coagulation Profile: Recent Labs  Lab 06/10/2018 1215  INR 2.8*   Cardiac Enzymes: Recent Labs  Lab 06/19/2018 1215  TROPONINI 0.06*   Urine analysis:    Component Value Date/Time  COLORURINE YELLOW 06/16/2018 1432   APPEARANCEUR CLOUDY (A) 06/10/2018 1432   LABSPEC 1.015 06/24/2018 1432   PHURINE 6.0 06/18/2018 1432   GLUCOSEU NEGATIVE 06/13/2018 1432   HGBUR SMALL (A) 06/05/2018 1432   BILIRUBINUR NEGATIVE 05/31/2018 1432   KETONESUR 15 (A) 06/13/2018 1432   PROTEINUR 30 (A) 06/10/2018 1432   UROBILINOGEN 0.2 01/12/2012 1318   NITRITE NEGATIVE 05/29/2018 1432   LEUKOCYTESUR MODERATE (A) 05/27/2018 1432   Radiological Exams on Admission: Dg Chest 2 View  Result Date: 06/05/2018 CLINICAL DATA:  Loss of consciousness, no chest symptoms EXAM: CHEST - 2 VIEW COMPARISON:  08/12/2017 FINDINGS: Bilateral diffuse interstitial thickening. Small bilateral pleural effusions. No pneumothorax. Stable cardiomegaly. Prior CABG. Dual lead cardiac pacemaker. No acute osseous abnormality. IMPRESSION: Findings consistent with mild CHF. Electronically Signed   By: Kathreen Devoid   On: 06/11/2018 14:39   Ct Head Wo Contrast  Result Date: 06/10/2018 CLINICAL DATA:  Altered level of consciousness. EXAM: CT HEAD WITHOUT CONTRAST TECHNIQUE: Contiguous axial images were obtained from the base of the skull through the vertex without intravenous contrast.  COMPARISON:  CT scan of August 14, 2017. FINDINGS: Brain: Mild diffuse cortical atrophy is noted. Mild chronic ischemic white matter disease is noted. No mass effect or midline shift is noted. Ventricular size is within normal limits. There is no evidence of mass lesion, hemorrhage or acute infarction. Vascular: No hyperdense vessel or unexpected calcification. Skull: Normal. Negative for fracture or focal lesion. Sinuses/Orbits: No acute finding. Other: None. IMPRESSION: Mild diffuse cortical atrophy. Mild chronic ischemic white matter disease. No acute intracranial abnormality seen. Electronically Signed   By: Marijo Conception, M.D.   On: 06/09/2018 14:18   Ct Renal Stone Study  Result Date: 06/02/2018 CLINICAL DATA:  Left lower quadrant pain. EXAM: CT ABDOMEN AND PELVIS WITHOUT CONTRAST TECHNIQUE: Multidetector CT imaging of the abdomen and pelvis was performed following the standard protocol without IV contrast. COMPARISON:  CT, 01/05/2017 FINDINGS: Lower chest: Mild cardiomegaly. Small right pleural effusion. Vascular prominence and mild interstitial thickening at the lung bases. Mild dependent subsegmental atelectasis, right lower lobe. Hepatobiliary: Liver normal in size. No mass or focal lesion. Dependent gallstones and a mostly decompressed gallbladder. No bile duct dilation. Pancreas: Unremarkable. No pancreatic ductal dilatation or surrounding inflammatory changes. Spleen: Normal in size without focal abnormality. Adrenals/Urinary Tract: No adrenal masses. Moderate to severe right renal atrophy. Left kidney normal in size. Both kidneys normal in position. Subcentimeter hyperattenuating mass projects posteriorly from the right kidney pole, consistent with a mildly complicated cyst, stable from the prior CT. No other renal masses, no stones and no hydronephrosis. Normal ureters. Bladder is mildly distended. No bladder wall thickening. There is air that tracks along the lower anterior bladder wall, which may  reside within the wall. Stomach/Bowel: Stomach is unremarkable. Small bowel is normal in caliber. No wall thickening or evidence of inflammation. Colon is normal caliber. There are numerous diverticula most evident along the sigmoid colon. No evidence of diverticulitis. No colonic wall thickening or other inflammatory process. Normal appendix visualized. Vascular/Lymphatic: Mild aortic atherosclerosis. No aneurysm. No enlarged lymph nodes. Reproductive: Stable changes from a prior prostatectomy and penile implant insertion. Other: Small amount of ascites collects adjacent to the liver and spleen, tracks along the pericolic gutters and extends into the pelvic recesses. No free air. Musculoskeletal: No fracture or acute finding. Degenerative changes throughout the visualized spine and arthropathic changes of both hips. No osteoblastic or osteolytic lesions. IMPRESSION: 1. Small right pleural  effusion, lung base vascular prominence and interstitial thickening and mild cardiomegaly. Findings support mild congestive heart failure if there are symptoms of shortness of breath. 2. Small amount of ascites unclear etiology. 3. Small amount of air tracks along the anterior inferior aspect of the bladder, appearing to project within the bladder wall. There is no bladder wall thickening. No bladder masses. The etiology of this finding is unclear. Correlate with any history of recent bladder instrumentation. Emphysematous cystitis should be considered in the proper clinical setting, despite the lack of bladder wall thickening. 4. No other evidence of an acute abnormality. 5. Numerous colonic diverticula mostly along the sigmoid colon. No evidence of diverticulitis. 6. Moderate to marked right renal atrophy. 7. Aortic atherosclerosis. Electronically Signed   By: Lajean Manes M.D.   On: 05/28/2018 14:26    EKG: Independently reviewed.   Assessment/Plan Principal Problem:   Acute cystitis  Continue Rocephin, culture  pending  IVF's LR 75 mL/hr Active Problems:   Acute kidney injury ([place])  Gentle hydration as above, he does have LE edema  Monitor Cr  Hold nephrotoxins   Elevated Troponin  Likely demand ischemia, elevated at baseline  No EKG changes  Trend x3 total   CAD (coronary artery disease)  Cont Lipitor 40 mg/d   Atrioventricular block, complete ([facility])  Pacemaker   Hypertension  Cont Metoprolol tartrate 50 mg bid  Hold losartan   Paroxysmal atrial fibrillation ([facility])    Cont Eliquis   Dementia ([place]) with acute encephalopathy  AE 2/2 above  Call son, [name], if sundowning [phone]   Rectal bleeding  CBC reassuring  He does have an ext hemorrhoid, barring a large change in AM CBC, this can be followed up on outpatient  DVT prophylaxis: Cont Eliquis Code Status: Full - verified with son Family Communication: Son [name] and his wife [name] Disposition Plan: Home Consults called: None  Admission status: Observation  Severity of Illness: The appropriate patient status for this patient is OBSERVATION. Observation status is judged to be reasonable and necessary in order to provide the required intensity of service to ensure the patient's safety. The patient's presenting symptoms, physical exam findings, and initial radiographic and laboratory data in the context of their medical condition is felt to place them at decreased risk for further clinical deterioration. Furthermore, it is anticipated that the patient will be medically stable for discharge from the hospital within 2 midnights of admission. The following factors support the patient status of observation.   " The patient's presenting symptoms include AMS. " The physical exam findings include none. " The initial radiographic and laboratory data are suggestive of infection and AKI.    Shelda Pal, DO [facility] [email or web address] 06/07/2018, 4:26 PM

## 2018-05-26 NOTE — ED Provider Notes (Signed)
[facility]ERGENC[facility] DEPARTMENT Provider Note   CSN: [id number] Arrival date & time: 06/02/2018  1154    History   Chief Complaint Chief Complaint  Patient presents with  . Rectal Bleeding    HPI James Harvey is a 85 y.o. male history of CAD status post CABG, CKD, hypertension, high cholesterol here presenting with altered mental status.  Patient states at home and his son and daughter-in-law lives with him right now.  Over the last week or so, family noticed that he is more forgetful and occasionally gets disoriented.  Patient cannot tell me why he is here.  Family did bring patient to his primary care doctor today for evaluation.  They noticed some blood in his underwear so he was brought in for evaluation.  Of note, patient is taking Eliquis for atrial fibrillation.  Patient is reported compliant with it.  Patient states that he has been having blood in his stool for the last 6 weeks or so but denies any abdominal pain or vomiting.  Denies any fevers or travel or sick contacts.      The history is provided by the patient and a relative.   Level V caveat- AMS   Past Medical History:  Diagnosis Date  . Anxiety   . Atrioventricular block, complete ([place]) 02/19/10   s/p PPM by JA  . CAD (coronary artery disease)    s/p CABG 02/15/2010 (single vessel)  . CKD (chronic kidney disease) stage 3, GFR 30-59 ml/min ([place]) 04/20/2018  . Encounter for care of pacemaker 04/30/2018  . Hypercholesterolemia 04/20/2018  . Hyperglycemia 04/20/2018  . Hypertension   . Prostate cancer ([place])   . Unstable angina ([place]) 08/12/2017    Patient Active Problem List   Diagnosis Date Noted  . CKD (chronic kidney disease) stage 3, GFR 30-59 ml/min (H[facility]) 04/20/2018  . Hyperglycemia 04/20/2018  . Hypercholesterolemia 04/20/2018  . Paroxysmal atrial fibrillation ([place]) 08/14/2017  . S/P placement of cardiac pacemaker 08/14/2017  . Thrombocytopenia ([place]) 08/14/2017  . Delirium 08/14/2017  .  Closed C2 fracture ([place]) 01/06/2017  . Bilateral hand numbness 07/15/2016  . Other spondylosis with radiculopathy, cervical region 07/15/2016  . CAD (coronary artery disease) 05/24/2010  . Atrioventricular block, complete ([place]) 05/24/2010  . Hypertension 05/24/2010    Past Surgical History:  Procedure Laterality Date  . CORONARY STENT INTERVENTION N/A 08/13/2017   Procedure: CORONARY STENT INTERVENTION;  Surgeon: Nigel Mormon, MD;  Location: [facility];  Service: Cardiovascular;  Laterality: N/A;  . LEFT HEART CATH AND CORS/GRAFTS ANGIOGRAPHY N/A 08/13/2017   Procedure: LEFT HEART CATH AND CORS/GRAFTS ANGIOGRAPHY;  Surgeon: Nigel Mormon, MD;  Location: [facility];  Service: Cardiovascular;  Laterality: N/A;  . PACEMAKER INSERTION  02/19/10   by Greggory Brandy for complete heart block        Home Medications    Prior to Admission medications   Medication Sig Start Date End Date Taking? Authorizing Provider  ALPRAZolam Duanne Moron) 1 MG tablet Take 1 mg daily as needed by mouth for anxiety.   Yes [provider]  apixaban (ELIQUIS) 5 MG TABS tablet Take 1 tablet (5 mg total) by mouth 2 (two) times daily. 04/20/18  Yes Adrian Prows, MD  Ascorbic Acid (VITAMIN C PO) Take 1 capsule daily by mouth.   Yes [provider]  atorvastatin (LIPITOR) 40 MG tablet Take 40 mg daily by mouth.   Yes [provider]  Cyanocobalamin (VITAMIN B-12 PO) Take 1 tablet daily  by mouth.   Yes [provider]  dorzolamide (TRUSOPT) 2 % ophthalmic solution Place 1 drop into both eyes 2 (two) times daily. 05/03/18 05/03/19 Yes [provider]  ferrous sulfate 325 (65 FE) MG tablet Take 325 mg daily with breakfast by mouth.   Yes [provider]  losartan (COZAAR) 50 MG tablet Take 50 mg daily by mouth.   Yes [provider]  metoprolol tartrate (LOPRESSOR) 50 MG tablet Take 50 mg 2 (two) times daily by mouth.   Yes [provider]   Multiple Vitamin (MULTIVITAMIN) tablet Take 1 tablet by mouth daily.     Yes [provider]  nitroGLYCERIN (NITROSTAT) 0.4 MG SL tablet Place 1 tablet (0.4 mg total) under the tongue every 5 (five) minutes x 3 doses as needed for chest pain. 08/14/17  Yes Patwardhan, Manish J, MD  omeprazole (PRILOSEC) 40 MG capsule Take 40 mg daily by mouth.   Yes [provider]  Travoprost, BAK Free, (TRAVATAN Z) 0.004 % SOLN ophthalmic solution Place 1 drop into both eyes at bedtime. 07/27/17  Yes [provider]  clopidogrel (PLAVIX) 75 MG tablet Take 1 tablet (75 mg total) by mouth daily. Patient not taking: Reported on 05/30/2018 08/15/17   Nigel Mormon, MD    Family History Family History  Problem Relation Age of Onset  . Hypertension Other     Social History Social History   Tobacco Use  . Smoking status: Never Smoker  . Smokeless tobacco: Never Used  Substance Use Topics  . Alcohol use: Yes  . Drug use: No     Allergies   Patient has no known allergies.   Review of Systems Review of Systems  Unable to perform ROS: Mental status change  Gastrointestinal: Positive for blood in stool and hematochezia.  All other systems reviewed and are negative.    Physical Exam Updated Vital Signs BP 117/65   Pulse 65   Temp 98.1 F (36.7 C) (Oral)   Resp (!) 27   SpO2 100%   Physical Exam Vitals signs and nursing note reviewed.  Constitutional:      Appearance: Normal appearance.     Comments: Pleasantly confused   HENT:     Head: Normocephalic.     Right Ear: Tympanic membrane normal.     Left Ear: Tympanic membrane normal.     Nose: Nose normal.     Mouth/Throat:     Mouth: Mucous membranes are moist.  Eyes:     Extraocular Movements: Extraocular movements intact.     Pupils: Pupils are equal, round, and reactive to light.  Neck:     Musculoskeletal: Normal range of motion.  Cardiovascular:     Rate and Rhythm: Normal rate and regular rhythm.      Pulses: Normal pulses.  Pulmonary:     Effort: Pulmonary effort is normal.  Abdominal:     General: Abdomen is flat.     Palpations: Abdomen is soft.  Musculoskeletal: Normal range of motion.  Skin:    General: Skin is warm.     Capillary Refill: Capillary refill takes less than 2 seconds.  Neurological:     General: No focal deficit present.     Mental Status: He is alert.     Comments: A & O x 2. CN 2-12 intact. Nl strength throughout   Psychiatric:        Mood and Affect: Mood normal.        Behavior: Behavior normal.  ED Treatments / Results  Labs (all labs ordered are listed, but only abnormal results are displayed) Labs Reviewed  CBC WITH DIFFERENTIAL/PLATELET - Abnormal; Notable for the following components:      Result Value   RBC 4.19 (*)    Hemoglobin 12.3 (*)    RDW 17.2 (*)    nRBC 0.6 (*)    All other components within normal limits  COMPREHENSIVE METABOLIC PANEL - Abnormal; Notable for the following components:   Chloride 113 (*)    CO2 18 (*)    BUN 62 (*)    Creatinine, Ser 2.28 (*)    Albumin 3.3 (*)    AST 69 (*)    Total Bilirubin 1.9 (*)    GFR calc non Af Amer 25 (*)    GFR calc Af Amer 29 (*)    All other components within normal limits  PROTIME-INR - Abnormal; Notable for the following components:   Prothrombin Time 28.8 (*)    INR 2.8 (*)    All other components within normal limits  TROPONIN I - Abnormal; Notable for the following components:   Troponin I 0.06 (*)    All other components within normal limits  URINALYSIS, ROUTINE W REFLEX MICROSCOPIC - Abnormal; Notable for the following components:   APPearance CLOUDY (*)    Hgb urine dipstick SMALL (*)    Ketones, ur 15 (*)    Protein, ur 30 (*)    Leukocytes,Ua MODERATE (*)    All other components within normal limits  URINALYSIS, MICROSCOPIC (REFLEX) - Abnormal; Notable for the following components:   Bacteria, UA MANY (*)    Non Squamous Epithelial PRESENT (*)    All  other components within normal limits  URINE CULTURE  BRAIN NATRIURETIC PEPTIDE  POC OCCULT BLOOD, ED  TYPE AND SCREEN    EKG None  Radiology Dg Chest 2 View  Result Date: 05/30/2018 CLINICAL DATA:  Loss of consciousness, no chest symptoms EXAM: CHEST - 2 VIEW COMPARISON:  08/12/2017 FINDINGS: Bilateral diffuse interstitial thickening. Small bilateral pleural effusions. No pneumothorax. Stable cardiomegaly. Prior CABG. Dual lead cardiac pacemaker. No acute osseous abnormality. IMPRESSION: Findings consistent with mild CHF. Electronically Signed   By: Kathreen Devoid   On: 06/23/2018 14:39   Ct Head Wo Contrast  Result Date: 06/13/2018 CLINICAL DATA:  Altered level of consciousness. EXAM: CT HEAD WITHOUT CONTRAST TECHNIQUE: Contiguous axial images were obtained from the base of the skull through the vertex without intravenous contrast. COMPARISON:  CT scan of August 14, 2017. FINDINGS: Brain: Mild diffuse cortical atrophy is noted. Mild chronic ischemic white matter disease is noted. No mass effect or midline shift is noted. Ventricular size is within normal limits. There is no evidence of mass lesion, hemorrhage or acute infarction. Vascular: No hyperdense vessel or unexpected calcification. Skull: Normal. Negative for fracture or focal lesion. Sinuses/Orbits: No acute finding. Other: None. IMPRESSION: Mild diffuse cortical atrophy. Mild chronic ischemic white matter disease. No acute intracranial abnormality seen. Electronically Signed   By: Marijo Conception, M.D.   On: 06/12/2018 14:18   Ct Renal Stone Study  Result Date: 06/19/2018 CLINICAL DATA:  Left lower quadrant pain. EXAM: CT ABDOMEN AND PELVIS WITHOUT CONTRAST TECHNIQUE: Multidetector CT imaging of the abdomen and pelvis was performed following the standard protocol without IV contrast. COMPARISON:  CT, 01/05/2017 FINDINGS: Lower chest: Mild cardiomegaly. Small right pleural effusion. Vascular prominence and mild interstitial thickening at the  lung bases. Mild dependent subsegmental atelectasis, right lower lobe.  Hepatobiliary: Liver normal in size. No mass or focal lesion. Dependent gallstones and a mostly decompressed gallbladder. No bile duct dilation. Pancreas: Unremarkable. No pancreatic ductal dilatation or surrounding inflammatory changes. Spleen: Normal in size without focal abnormality. Adrenals/Urinary Tract: No adrenal masses. Moderate to severe right renal atrophy. Left kidney normal in size. Both kidneys normal in position. Subcentimeter hyperattenuating mass projects posteriorly from the right kidney pole, consistent with a mildly complicated cyst, stable from the prior CT. No other renal masses, no stones and no hydronephrosis. Normal ureters. Bladder is mildly distended. No bladder wall thickening. There is air that tracks along the lower anterior bladder wall, which may reside within the wall. Stomach/Bowel: Stomach is unremarkable. Small bowel is normal in caliber. No wall thickening or evidence of inflammation. Colon is normal caliber. There are numerous diverticula most evident along the sigmoid colon. No evidence of diverticulitis. No colonic wall thickening or other inflammatory process. Normal appendix visualized. Vascular/Lymphatic: Mild aortic atherosclerosis. No aneurysm. No enlarged lymph nodes. Reproductive: Stable changes from a prior prostatectomy and penile implant insertion. Other: Small amount of ascites collects adjacent to the liver and spleen, tracks along the pericolic gutters and extends into the pelvic recesses. No free air. Musculoskeletal: No fracture or acute finding. Degenerative changes throughout the visualized spine and arthropathic changes of both hips. No osteoblastic or osteolytic lesions. IMPRESSION: 1. Small right pleural effusion, lung base vascular prominence and interstitial thickening and mild cardiomegaly. Findings support mild congestive heart failure if there are symptoms of shortness of breath. 2.  Small amount of ascites unclear etiology. 3. Small amount of air tracks along the anterior inferior aspect of the bladder, appearing to project within the bladder wall. There is no bladder wall thickening. No bladder masses. The etiology of this finding is unclear. Correlate with any history of recent bladder instrumentation. Emphysematous cystitis should be considered in the proper clinical setting, despite the lack of bladder wall thickening. 4. No other evidence of an acute abnormality. 5. Numerous colonic diverticula mostly along the sigmoid colon. No evidence of diverticulitis. 6. Moderate to marked right renal atrophy. 7. Aortic atherosclerosis. Electronically Signed   By: Lajean Manes M.D.   On: 05/31/2018 14:26    Procedures Procedures (including critical care time)  CRITICAL CARE Performed by: Wandra Arthurs   Total critical care time: 30 minutes  Critical care time was exclusive of separately billable procedures and treating other patients.  Critical care was necessary to treat or prevent imminent or life-threatening deterioration.  Critical care was time spent personally by me on the following activities: development of treatment plan with patient and/or surrogate as well as nursing, discussions with consultants, evaluation of patient's response to treatment, examination of patient, obtaining history from patient or surrogate, ordering and performing treatments and interventions, ordering and review of laboratory studies, ordering and review of radiographic studies, pulse oximetry and re-evaluation of patient's condition.   Medications Ordered in ED Medications  cefTRIAXone (ROCEPHIN) 1 g in sodium chloride 0.9 % 100 mL IVPB (has no administration in time range)  sodium chloride 0.9 % bolus 1,000 mL (0 mLs Intravenous Stopped 06/14/2018 1454)     Initial Impression / Assessment and Plan / ED Course  I have reviewed the triage vital signs and the nursing notes.  Pertinent labs &  imaging results that were available during my care of the patient were reviewed by me and considered in my medical decision making (see chart for details).  James Harvey is a 85 y.o. male here with blood in stool, confusion. Consider dementia vs subdural vs UTI vs pneumonia vs electrolyte abnormality.   3:15 PM Labs showed AKI with Cr 2.2. INR 2.8. UA + UTI. Trop mildly elevated at 0.06, likely demand ischemia. CT ab/pel showed possible emphysematous cystitis but patient had a bladder catheterization so it may be normal after cath. Given rocephin. Will admit for UTI, AKI, elevated trop.    Final Clinical Impressions(s) / ED Diagnoses   Final diagnoses:  None    ED Discharge Orders    None       Drenda Freeze, MD 06/07/2018 1551

## 2018-05-26 NOTE — Progress Notes (Signed)
She has been on apixaban for afib at the dose of 5mg  BID. She is >80, wt>60kg per outpt vitals, scr>1.5 due to ARF. D/w Dr Nani Ravens, we will change her dose to 2.5mg  BID for now.   Onnie Boer, PharmD, BCIDP, AAHIVP, CPP Infectious Disease Pharmacist 05/28/2018 5:48 PM

## 2018-05-26 NOTE — ED Triage Notes (Signed)
Pt brought in by family, they report rectal bleeding, moderate bright red amount noted in underwear. Pt unable to give further info.

## 2018-05-27 ENCOUNTER — Ambulatory Visit: Payer: Self-pay

## 2018-05-27 DIAGNOSIS — I2510 Atherosclerotic heart disease of native coronary artery without angina pectoris: Secondary | ICD-10-CM | POA: Diagnosis present

## 2018-05-27 DIAGNOSIS — Z1629 Resistance to other single specified antibiotic: Secondary | ICD-10-CM | POA: Diagnosis present

## 2018-05-27 DIAGNOSIS — D696 Thrombocytopenia, unspecified: Secondary | ICD-10-CM | POA: Diagnosis present

## 2018-05-27 DIAGNOSIS — K644 Residual hemorrhoidal skin tags: Secondary | ICD-10-CM | POA: Diagnosis present

## 2018-05-27 DIAGNOSIS — Z515 Encounter for palliative care: Secondary | ICD-10-CM | POA: Diagnosis present

## 2018-05-27 DIAGNOSIS — I442 Atrioventricular block, complete: Secondary | ICD-10-CM | POA: Diagnosis present

## 2018-05-27 DIAGNOSIS — I5033 Acute on chronic diastolic (congestive) heart failure: Secondary | ICD-10-CM | POA: Diagnosis present

## 2018-05-27 DIAGNOSIS — Z1611 Resistance to penicillins: Secondary | ICD-10-CM | POA: Diagnosis present

## 2018-05-27 DIAGNOSIS — I130 Hypertensive heart and chronic kidney disease with heart failure and stage 1 through stage 4 chronic kidney disease, or unspecified chronic kidney disease: Secondary | ICD-10-CM | POA: Diagnosis present

## 2018-05-27 DIAGNOSIS — B961 Klebsiella pneumoniae [K. pneumoniae] as the cause of diseases classified elsewhere: Secondary | ICD-10-CM | POA: Diagnosis present

## 2018-05-27 DIAGNOSIS — N3001 Acute cystitis with hematuria: Secondary | ICD-10-CM | POA: Diagnosis present

## 2018-05-27 DIAGNOSIS — N179 Acute kidney failure, unspecified: Principal | ICD-10-CM

## 2018-05-27 DIAGNOSIS — Z7189 Other specified counseling: Secondary | ICD-10-CM | POA: Diagnosis not present

## 2018-05-27 DIAGNOSIS — N3000 Acute cystitis without hematuria: Secondary | ICD-10-CM

## 2018-05-27 DIAGNOSIS — K921 Melena: Secondary | ICD-10-CM | POA: Diagnosis present

## 2018-05-27 DIAGNOSIS — G9341 Metabolic encephalopathy: Secondary | ICD-10-CM | POA: Diagnosis present

## 2018-05-27 DIAGNOSIS — F419 Anxiety disorder, unspecified: Secondary | ICD-10-CM | POA: Diagnosis present

## 2018-05-27 DIAGNOSIS — R9401 Abnormal electroencephalogram [EEG]: Secondary | ICD-10-CM | POA: Diagnosis present

## 2018-05-27 DIAGNOSIS — R410 Disorientation, unspecified: Secondary | ICD-10-CM | POA: Diagnosis present

## 2018-05-27 DIAGNOSIS — E860 Dehydration: Secondary | ICD-10-CM | POA: Diagnosis present

## 2018-05-27 DIAGNOSIS — I248 Other forms of acute ischemic heart disease: Secondary | ICD-10-CM | POA: Diagnosis present

## 2018-05-27 DIAGNOSIS — N183 Chronic kidney disease, stage 3 (moderate): Secondary | ICD-10-CM | POA: Diagnosis present

## 2018-05-27 DIAGNOSIS — F05 Delirium due to known physiological condition: Secondary | ICD-10-CM | POA: Diagnosis present

## 2018-05-27 DIAGNOSIS — T465X5A Adverse effect of other antihypertensive drugs, initial encounter: Secondary | ICD-10-CM | POA: Diagnosis present

## 2018-05-27 DIAGNOSIS — E7800 Pure hypercholesterolemia, unspecified: Secondary | ICD-10-CM | POA: Diagnosis present

## 2018-05-27 DIAGNOSIS — I480 Paroxysmal atrial fibrillation: Secondary | ICD-10-CM | POA: Diagnosis present

## 2018-05-27 DIAGNOSIS — R7989 Other specified abnormal findings of blood chemistry: Secondary | ICD-10-CM | POA: Diagnosis not present

## 2018-05-27 DIAGNOSIS — F0390 Unspecified dementia without behavioral disturbance: Secondary | ICD-10-CM | POA: Diagnosis present

## 2018-05-27 DIAGNOSIS — G9340 Encephalopathy, unspecified: Secondary | ICD-10-CM | POA: Diagnosis not present

## 2018-05-27 LAB — BASIC METABOLIC PANEL
Anion gap: 9 (ref 5–15)
BUN: 62 mg/dL — ABNORMAL HIGH (ref 8–23)
CO2: 15 mmol/L — ABNORMAL LOW (ref 22–32)
Calcium: 8.7 mg/dL — ABNORMAL LOW (ref 8.9–10.3)
Chloride: 115 mmol/L — ABNORMAL HIGH (ref 98–111)
Creatinine, Ser: 2.11 mg/dL — ABNORMAL HIGH (ref 0.61–1.24)
GFR calc Af Amer: 32 mL/min — ABNORMAL LOW (ref >60)
GFR calc non Af Amer: 28 mL/min — ABNORMAL LOW (ref >60)
Glucose, Bld: 113 mg/dL — ABNORMAL HIGH (ref 70–99)
Potassium: 4.1 mmol/L (ref 3.5–5.1)
Sodium: 139 mmol/L (ref 135–145)

## 2018-05-27 LAB — TROPONIN I: Troponin I: 0.05 ng/mL (ref <0.03)

## 2018-05-27 LAB — CBC
HCT: 35.5 % — ABNORMAL LOW (ref 39.0–52.0)
Hemoglobin: 11.1 g/dL — ABNORMAL LOW (ref 13.0–17.0)
MCH: 28.3 pg (ref 26.0–34.0)
MCHC: 31.3 g/dL (ref 30.0–36.0)
MCV: 90.6 fL (ref 80.0–100.0)
Platelets: 160 10*3/uL (ref 150–400)
RBC: 3.92 MIL/uL — ABNORMAL LOW (ref 4.22–5.81)
RDW: 16.6 % — ABNORMAL HIGH (ref 11.5–15.5)
WBC: 8.6 10*3/uL (ref 4.0–10.5)
nRBC: 0.5 % — ABNORMAL HIGH (ref 0.0–0.2)

## 2018-05-27 MED ORDER — OLANZAPINE 5 MG PO TBDP
5.0000 mg | ORAL_TABLET | Freq: Once | ORAL | Status: AC
  Administered 2018-05-27: 5 mg via ORAL
  Filled 2018-05-27 (×2): qty 1

## 2018-05-27 MED ORDER — HALOPERIDOL LACTATE 5 MG/ML IJ SOLN
2.0000 mg | Freq: Once | INTRAMUSCULAR | Status: AC
  Administered 2018-05-27: 02:00:00 2 mg via INTRAMUSCULAR
  Filled 2018-05-27: qty 1

## 2018-05-27 MED ORDER — HYDROCORTISONE ACETATE 25 MG RE SUPP
25.0000 mg | Freq: Two times a day (BID) | RECTAL | Status: AC
  Administered 2018-05-27 – 2018-05-28 (×4): 25 mg via RECTAL
  Filled 2018-05-27 (×4): qty 1

## 2018-05-27 MED ORDER — LORAZEPAM 2 MG/ML IJ SOLN
1.0000 mg | Freq: Four times a day (QID) | INTRAMUSCULAR | Status: DC | PRN
  Administered 2018-05-27: 1 mg via INTRAVENOUS
  Filled 2018-05-27 (×2): qty 1

## 2018-05-27 NOTE — Progress Notes (Addendum)
Pt extremely confused and continously trying to get OOB. Night time coverage NP Bodenheimer made aware. Order provided for four-point restraints. Pt placed in four-point restraints. Notified pt son, [name], that his father was placed in four-point restraints in order to keep him safe. Pt continues to be agitated. Will not replace IV until next abx dose per NP orders. Will continue to monitor and treat per orders.

## 2018-05-27 NOTE — Evaluation (Signed)
Physical Therapy Evaluation Patient Details Name: James Harvey MRN: [id number] DOB: Feb 27, 1932 Today's Date: 05/27/2018   History of Present Illness  85 y.o. male with history of atrial fibrillation on anticoagulation with Eliquis, CAD s/p PCI to mid LAD in June 2019, s/p permanent pacemaker implantation presented to the ED with worsening confusion, intermittent rectal bleeding-found to have acute kidney injury. Dx acute metabolic encephalopathy likely due to AKI, on top of underlying dementia, and UTI  Clinical Impression  Spoke with James Harvey on phone prior to evaluation, and he reported that his father's confusion has been getting progressively worse especially over the last 2 weeks. As a result a family member is with him 24/7. Son reports father is still independent with ambulation with cane, he did not mention any difficulty with breathing. Pt is currently limited in safe mobility by decreased cognition and safety awareness as well as decreased activity tolerance resulting in 3/4 DOE with short distance ambulation. Generally, pt is mod I for bed mobility, min guard for transfers and min A for ambulation of 20 feet with increased WoB. PT recommends HHPT level rehab at discharge. PT will continue to follow acutely.     Follow Up Recommendations Home health PT;Supervision/Assistance - 24 hour    Equipment Recommendations  None recommended by PT    Recommendations for Other Services       Precautions / Restrictions Precautions Precautions: Fall Precaution Comments: RN report pt climbing out of bed over bedrails Restrictions Weight Bearing Restrictions: No      Mobility  Bed Mobility Overal bed mobility: Modified Independent                Transfers Overall transfer level: Needs assistance Equipment used: None Transfers: Sit to/from Stand Sit to Stand: Min guard         General transfer comment: min guard for safety with power up and  steadying  Ambulation/Gait Ambulation/Gait assistance: Min assist Gait Distance (Feet): 20 Feet Assistive device: Rolling walker (2 wheeled) Gait Pattern/deviations: Shuffle;Decreased step length - right;Decreased step length - left;Trunk flexed Gait velocity: slowed Gait velocity interpretation: <1.31 ft/sec, indicative of household ambulator General Gait Details: minA for steadying with RW, pt ambulated to doorway with increasing WoB, stopped at doorway with 3/4 DoE and requested to return to bed        Balance Overall balance assessment: Mild deficits observed, not formally tested                                           Pertinent Vitals/Pain Pain Assessment: No/denies pain    Home Living Family/patient expects to be discharged to:: Private residence Living Arrangements: Children(last month ) Available Help at Discharge: Family;Available 24 hours/day Type of Home: House Home Access: Stairs to enter Entrance Stairs-Rails: Left Entrance Stairs-Number of Steps: 2 Home Layout: Laundry or work area in basement;Multi-level;Able to live on main level with bedroom/bathroom Home Equipment: Gilford Rile - 2 wheels;Cane - single point;Shower seat - built in      Prior Function Level of Independence: Independent with assistive device(s)         Comments: uses a cane for ambulation sometime        Extremity/Trunk Assessment   Upper Extremity Assessment Upper Extremity Assessment: Overall WFL for tasks assessed;Difficult to assess due to impaired cognition    Lower Extremity Assessment Lower Extremity Assessment: Overall WFL for tasks assessed;Difficult to  assess due to impaired cognition       Communication   Communication: No difficulties  Cognition Arousal/Alertness: Awake/alert Behavior During Therapy: Restless;Agitated Overall Cognitive Status: No family/caregiver present to determine baseline cognitive functioning                                  General Comments: Spoke to son on phone prior to eval for PLOF and home set up      General Comments General comments (skin integrity, edema, etc.): Pt with 3/4 DoE however on RA SaO2 98%O2        Assessment/Plan    PT Assessment Patient needs continued PT services  PT Problem List Decreased strength;Decreased activity tolerance;Decreased mobility;Decreased knowledge of use of DME;Decreased safety awareness;Cardiopulmonary status limiting activity       PT Treatment Interventions DME instruction;Gait training;Stair training;Functional mobility training;Therapeutic activities;Therapeutic exercise;Balance training;Cognitive remediation;Patient/family education    PT Goals (Current goals can be found in the Care Plan section)  Acute Rehab PT Goals Patient Stated Goal: get out of here PT Goal Formulation: With patient Time For Goal Achievement: 06/10/18 Potential to Achieve Goals: Fair    Frequency Min 3X/week    AM-PAC PT "6 Clicks" Mobility  Outcome Measure Help needed turning from your back to your side while in a flat bed without using bedrails?: None Help needed moving from lying on your back to sitting on the side of a flat bed without using bedrails?: None Help needed moving to and from a bed to a chair (including a wheelchair)?: A Little Help needed standing up from a chair using your arms (e.g., wheelchair or bedside chair)?: None Help needed to walk in hospital room?: A Little Help needed climbing 3-5 steps with a railing? : A Lot 6 Click Score: 20    End of Session Equipment Utilized During Treatment: Gait belt Activity Tolerance: Patient limited by fatigue Patient left: in bed;with call bell/phone within reach;with bed alarm set Nurse Communication: Mobility status PT Visit Diagnosis: Unsteadiness on feet (R26.81);Other abnormalities of gait and mobility (R26.89);Muscle weakness (generalized) (M62.81);Difficulty in walking, not elsewhere classified  (R26.2)    Time: 7482-7078 PT Time Calculation (min) (ACUTE ONLY): 20 min   Charges:   PT Evaluation $PT Eval Moderate Complexity: 1 Mod          Dorcas Melito B. Migdalia Dk PT, DPT Acute Rehabilitation Services Pager [phone] Office [phone]   [place] 05/27/2018, 1:56 PM

## 2018-05-27 NOTE — Progress Notes (Signed)
PROGRESS NOTE        PATIENT DETAILS Name: James Harvey Age: 85 y.o. Sex: male Date of Birth: 1933/02/14 Admit Date: 06/11/2018 Admitting Physician Shelda Pal, DO GHW:EXHBZJ, Jeneen Rinks, MD  Brief Narrative: Patient is a 85 y.o. male with history of atrial fibrillation on anticoagulation with Eliquis, CAD s/p PCI to mid LAD in June 2019, s/p permanent pacemaker implantation presented to the ED with worsening confusion, intermittent rectal bleeding-found to have acute kidney injury and admitted to the hospitalist service.  See below for further details.  Subjective: Awake-pleasantly confused-answer some questions appropriately.  Nursing staff-no hematochezia noted overnight.  Assessment/Plan: Acute metabolic encephalopathy: Encephalopathy multifactorial-likely secondary to AKI, complicated cystitis.  No family at bedside-but he seems pleasantly confused-answer's some questions appropriately-suspect that he has improved-do not think he is that far from his baseline.  CT head without acute abnormalities on admission.  Per patient's son who I talked to over the phone-he does not think patient has dementia in the past-apparently patient was living alone-and only became confused 2 weeks prior to this hospital stay, family started living with him due to confusion.  AKI: Suspect mostly hemodynamically mediated-likely secondary to UTI/dehydration and the use of losartan.  Creatinine slowly improving out of care-not yet back to baseline.  Continue gentle hydration recheck electrolytes tomorrow.  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  Await urine culture results-continue IV Rocephin.  Hematochezia: Overnight-we will start Anusol suppository x3 days-no significant drop in hemoglobin (mild  drop in hemoglobin is likely secondary to IV fluid dilution)-cautiously continue with Eliquis for now.  Appears that he is on Plavix as well-that has been discontinued for now.  If bleeding worsens-we will need to stop anticoagulation and consult GI.  PAF: Rate controlled with metoprolol-continue Eliquis cautiously for now.  After speaking with patient's primary cardiologist over the phone-have discontinued Plavix.  ChadsVasc score of 4  Minimally elevated troponin: Trend is flat-not consistent with ACS.  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 Jean Rosenthal to hold Plavix given hematochezia.  Continue statin  ??Dementia: Supportive care-expect some amount of delirium/sundowning during this hospital stay.  DVT Prophylaxis: Full dose anticoagulation with Eliquis  Code Status: Full code   Family Communication: Son over the phone  Disposition Plan: Remain inpatient-but will plan on Home health vs SNF on discharge-await PT evaluation  Antimicrobial agents: Anti-infectives (From admission, onward)   Start     Dose/Rate Route Frequency Ordered Stop   05/27/18 1600  cefTRIAXone (ROCEPHIN) 1 g in sodium chloride 0.9 % 100 mL IVPB     1 g 200 mL/hr over 30 Minutes Intravenous Every 24 hours 06/11/2018 1630     06/06/2018 1515  cefTRIAXone (ROCEPHIN) 1 g in sodium chloride 0.9 % 100 mL IVPB     1 g 200 mL/hr over 30 Minutes Intravenous  Once 06/22/2018 1511 06/16/2018 1700      Procedures: None  CONSULTS:  None  Time spent: 25- minutes-Greater than 50% of this time was spent in counseling, explanation of diagnosis, planning of further management, and coordination of care.  MEDICATIONS: Scheduled  Meds: . apixaban  2.5 mg Oral BID  . atorvastatin  40 mg Oral Daily  . dorzolamide  1 drop Both Eyes BID  . ferrous sulfate   325 mg Oral Q breakfast  . latanoprost  1 drop Both Eyes QHS  . metoprolol tartrate  50 mg Oral BID  . pantoprazole  40 mg Oral Daily   Continuous Infusions: . cefTRIAXone (ROCEPHIN)  IV    . lactated ringers 75 mL/hr at 05/28/2018 1850   PRN Meds:.acetaminophen **OR** acetaminophen, nitroGLYCERIN, ondansetron **OR** ondansetron (ZOFRAN) IV, polyethylene glycol   PHYSICAL EXAM: Vital signs: Vitals:   06/09/2018 1848 06/03/2018 2204 05/27/18 0533 05/27/18 0905  BP:  (!) 147/78 139/63 138/82  Pulse:  66 (!) 57 69  Resp:  18    Temp:  98.6 F (37 C) 97.6 F (36.4 C)   TempSrc:   Axillary   SpO2:  99% 99%   Weight: 99.7 kg     Height: 6' (1.829 m)      Filed Weights   06/03/2018 1848  Weight: 99.7 kg   Body mass index is 29.8 kg/m.   General appearance :Awake, pleasantly confused but not in any distress HEENT: Atraumatic and Normocephalic Neck: supple Resp:Good air entry bilaterally, no added sounds  CVS: S1 S2 regular GI: Bowel sounds present, Non tender and not distended with no gaurding, rigidity or rebound.No organomegaly Extremities: B/L Lower Ext shows no edema, both legs are warm to touch Neurology: Nonfocal Musculoskeletal:No digital cyanosis Skin:No Rash, warm and dry Wounds:N/A  I have personally reviewed following labs and imaging studies  LABORATORY DATA: CBC: Recent Labs  Lab 06/08/2018 1215 05/27/18 0037  WBC 8.8 8.6  NEUTROABS 7.0  --   HGB 12.3* 11.1*  HCT 40.3 35.5*  MCV 96.2 90.6  PLT 159 628    Basic Metabolic Panel: Recent Labs  Lab 05/28/2018 1215 05/27/18 0037  NA 140 139  K 4.8 4.1  CL 113* 115*  CO2 18* 15*  GLUCOSE 85 113*  BUN 62* 62*  CREATININE 2.28* 2.11*  CALCIUM 9.0 8.7*    GFR: Estimated Creatinine Clearance: 31.8 mL/min (A) (by C-G formula based on SCr of 2.11 mg/dL (H)).  Liver Function Tests: Recent Labs  Lab 05/27/2018 1215  AST 69*  ALT 43  ALKPHOS 99  BILITOT 1.9*  PROT 6.8  ALBUMIN 3.3*   No results for  input(s): LIPASE, AMYLASE in the last 168 hours. No results for input(s): AMMONIA in the last 168 hours.  Coagulation Profile: Recent Labs  Lab 06/12/2018 1215  INR 2.8*    Cardiac Enzymes: Recent Labs  Lab 06/09/2018 1215 06/21/2018 1825 05/27/18 0037  TROPONINI 0.06* 0.05* 0.05*    BNP (last 3 results) No results for input(s): PROBNP in the last 8760 hours.  HbA1C: No results for input(s): HGBA1C in the last 72 hours.  CBG: No results for input(s): GLUCAP in the last 168 hours.  Lipid Profile: No results for input(s): CHOL, HDL, LDLCALC, TRIG, CHOLHDL, LDLDIRECT in the last 72 hours.  Thyroid Function Tests: No results for input(s): TSH, T4TOTAL, FREET4, T3FREE, THYROIDAB in the last 72 hours.  Anemia Panel: No results for input(s): VITAMINB12, FOLATE, FERRITIN, TIBC, IRON, RETICCTPCT in the last 72 hours.  Urine analysis:    Component Value Date/Time   COLORURINE YELLOW 06/20/2018 1432   APPEARANCEUR CLOUDY (A) 06/02/2018 1432   LABSPEC 1.015 06/14/2018 1432   PHURINE 6.0 05/31/2018 1432   GLUCOSEU NEGATIVE 06/22/2018 1432   HGBUR SMALL (A)  06/23/2018 1432   BILIRUBINUR NEGATIVE 06/20/2018 1432   KETONESUR 15 (A) 06/05/2018 1432   PROTEINUR 30 (A) 06/21/2018 1432   UROBILINOGEN 0.2 01/12/2012 1318   NITRITE NEGATIVE 06/08/2018 1432   LEUKOCYTESUR MODERATE (A) 06/08/2018 1432    Sepsis Labs: Lactic Acid, Venous    Component Value Date/Time   LATICACIDVEN 2.46 (HH) 01/05/2017 1953    MICROBIOLOGY: No results found for this or any previous visit (from the past 240 hour(s)).  RADIOLOGY STUDIES/RESULTS: Dg Chest 2 View  Result Date: 06/20/2018 CLINICAL DATA:  Loss of consciousness, no chest symptoms EXAM: CHEST - 2 VIEW COMPARISON:  08/12/2017 FINDINGS: Bilateral diffuse interstitial thickening. Small bilateral pleural effusions. No pneumothorax. Stable cardiomegaly. Prior CABG. Dual lead cardiac pacemaker. No acute osseous abnormality. IMPRESSION: Findings  consistent with mild CHF. Electronically Signed   By: Kathreen Devoid   On: 06/05/2018 14:39   Ct Head Wo Contrast  Result Date: 06/13/2018 CLINICAL DATA:  Altered level of consciousness. EXAM: CT HEAD WITHOUT CONTRAST TECHNIQUE: Contiguous axial images were obtained from the base of the skull through the vertex without intravenous contrast. COMPARISON:  CT scan of August 14, 2017. FINDINGS: Brain: Mild diffuse cortical atrophy is noted. Mild chronic ischemic white matter disease is noted. No mass effect or midline shift is noted. Ventricular size is within normal limits. There is no evidence of mass lesion, hemorrhage or acute infarction. Vascular: No hyperdense vessel or unexpected calcification. Skull: Normal. Negative for fracture or focal lesion. Sinuses/Orbits: No acute finding. Other: None. IMPRESSION: Mild diffuse cortical atrophy. Mild chronic ischemic white matter disease. No acute intracranial abnormality seen. Electronically Signed   By: Marijo Conception, M.D.   On: 06/20/2018 14:18   Ct Renal Stone Study  Result Date: 06/13/2018 CLINICAL DATA:  Left lower quadrant pain. EXAM: CT ABDOMEN AND PELVIS WITHOUT CONTRAST TECHNIQUE: Multidetector CT imaging of the abdomen and pelvis was performed following the standard protocol without IV contrast. COMPARISON:  CT, 01/05/2017 FINDINGS: Lower chest: Mild cardiomegaly. Small right pleural effusion. Vascular prominence and mild interstitial thickening at the lung bases. Mild dependent subsegmental atelectasis, right lower lobe. Hepatobiliary: Liver normal in size. No mass or focal lesion. Dependent gallstones and a mostly decompressed gallbladder. No bile duct dilation. Pancreas: Unremarkable. No pancreatic ductal dilatation or surrounding inflammatory changes. Spleen: Normal in size without focal abnormality. Adrenals/Urinary Tract: No adrenal masses. Moderate to severe right renal atrophy. Left kidney normal in size. Both kidneys normal in position.  Subcentimeter hyperattenuating mass projects posteriorly from the right kidney pole, consistent with a mildly complicated cyst, stable from the prior CT. No other renal masses, no stones and no hydronephrosis. Normal ureters. Bladder is mildly distended. No bladder wall thickening. There is air that tracks along the lower anterior bladder wall, which may reside within the wall. Stomach/Bowel: Stomach is unremarkable. Small bowel is normal in caliber. No wall thickening or evidence of inflammation. Colon is normal caliber. There are numerous diverticula most evident along the sigmoid colon. No evidence of diverticulitis. No colonic wall thickening or other inflammatory process. Normal appendix visualized. Vascular/Lymphatic: Mild aortic atherosclerosis. No aneurysm. No enlarged lymph nodes. Reproductive: Stable changes from a prior prostatectomy and penile implant insertion. Other: Small amount of ascites collects adjacent to the liver and spleen, tracks along the pericolic gutters and extends into the pelvic recesses. No free air. Musculoskeletal: No fracture or acute finding. Degenerative changes throughout the visualized spine and arthropathic changes of both hips. No osteoblastic or osteolytic lesions. IMPRESSION: 1. Small  right pleural effusion, lung base vascular prominence and interstitial thickening and mild cardiomegaly. Findings support mild congestive heart failure if there are symptoms of shortness of breath. 2. Small amount of ascites unclear etiology. 3. Small amount of air tracks along the anterior inferior aspect of the bladder, appearing to project within the bladder wall. There is no bladder wall thickening. No bladder masses. The etiology of this finding is unclear. Correlate with any history of recent bladder instrumentation. Emphysematous cystitis should be considered in the proper clinical setting, despite the lack of bladder wall thickening. 4. No other evidence of an acute abnormality. 5.  Numerous colonic diverticula mostly along the sigmoid colon. No evidence of diverticulitis. 6. Moderate to marked right renal atrophy. 7. Aortic atherosclerosis. Electronically Signed   By: Lajean Manes M.D.   On: 06/09/2018 14:26     LOS: 0 days   Oren Binet, MD  [facility]  If 7PM-7AM, please contact night-coverage  Please page via [email or web address]  Go to [email or web address] and use [facility]'s universal password to access. If you do not have the password, please contact the hospital operator.  Locate the [facility] provider you are looking for under [facility] and page to a number that you can be directly reached. If you still have difficulty reaching the provider, please page the [facility] (Director on Call) for the Hospitalists listed on amion for assistance.  05/27/2018, 10:25 AM

## 2018-05-27 NOTE — Progress Notes (Signed)
Notified by lab that pt removed IV. Pt found to have sheets damped with blood. IV team paged, and IV replaced. Pt pulled out second IV shortly after. Pt bled significant amount again. Pt extremely confused. Oriented x 1. James Harvey paged and made aware. Will continue to monitor and treat per orders.

## 2018-05-27 NOTE — Progress Notes (Addendum)
Pt removed restraints and attempted to get OOB. Restraints reapplied. Will continue to monitor.

## 2018-05-27 NOTE — Care Management Obs Status (Signed)
[facility] NOTIFICATION   Patient Details  Name: James Harvey MRN: [id number] Date of Birth: [birth year]   Medicare Observation Status Notification Given:  Yes    Carles Collet, RN 05/27/2018, 10:19 AM

## 2018-05-27 NOTE — Discharge Instructions (Signed)
Information on my medicine - ELIQUIS (apixaban)  This medication education was reviewed with me or my healthcare representative as part of my discharge preparation.  You were taking this medication prior to this hospital admission.   Why was Eliquis prescribed for you? Eliquis was prescribed for you to reduce the risk of a blood clot forming that can cause a stroke if you have a medical condition called atrial fibrillation (a type of irregular heartbeat).  What do You need to know about Eliquis ? Take your Eliquis TWICE DAILY - one tablet in the morning and one tablet in the evening with or without food. If you have difficulty swallowing the tablet whole please discuss with your pharmacist how to take the medication safely.  Take Eliquis exactly as prescribed by your doctor and DO NOT stop taking Eliquis without talking to the doctor who prescribed the medication.  Stopping may increase your risk of developing a stroke.  Refill your prescription before you run out.  After discharge, you should have regular check-up appointments with your healthcare provider that is prescribing your Eliquis.  In the future your dose may need to be changed if your kidney function or weight changes by a significant amount or as you get older.  What do you do if you miss a dose? If you miss a dose, take it as soon as you remember on the same day and resume taking twice daily.  Do not take more than one dose of ELIQUIS at the same time to make up a missed dose.  Important Safety Information A possible side effect of Eliquis is bleeding. You should call your healthcare provider right away if you experience any of the following: Bleeding from an injury or your nose that does not stop. Unusual colored urine (red or dark brown) or unusual colored stools (red or black). Unusual bruising for unknown reasons. A serious fall or if you hit your head (even if there is no bleeding).  Some medicines may interact with  Eliquis and might increase your risk of bleeding or clotting while on Eliquis. To help avoid this, consult your healthcare provider or pharmacist prior to using any new prescription or non-prescription medications, including herbals, vitamins, non-steroidal anti-inflammatory drugs (NSAIDs) and supplements.  This website has more information on Eliquis (apixaban): http://www.eliquis.com/eliquis/home  

## 2018-05-28 ENCOUNTER — Inpatient Hospital Stay (HOSPITAL_COMMUNITY): Payer: Medicare Other

## 2018-05-28 DIAGNOSIS — R7989 Other specified abnormal findings of blood chemistry: Secondary | ICD-10-CM

## 2018-05-28 DIAGNOSIS — G9340 Encephalopathy, unspecified: Secondary | ICD-10-CM

## 2018-05-28 LAB — CBC
HCT: 37.6 % — ABNORMAL LOW (ref 39.0–52.0)
Hemoglobin: 11.5 g/dL — ABNORMAL LOW (ref 13.0–17.0)
MCH: 28.4 pg (ref 26.0–34.0)
MCHC: 30.6 g/dL (ref 30.0–36.0)
MCV: 92.8 fL (ref 80.0–100.0)
Platelets: 168 10*3/uL (ref 150–400)
RBC: 4.05 MIL/uL — ABNORMAL LOW (ref 4.22–5.81)
RDW: 17.1 % — ABNORMAL HIGH (ref 11.5–15.5)
WBC: 8.4 10*3/uL (ref 4.0–10.5)
nRBC: 0.6 % — ABNORMAL HIGH (ref 0.0–0.2)

## 2018-05-28 LAB — URINE CULTURE: Culture: >=100000 — AB

## 2018-05-28 LAB — BASIC METABOLIC PANEL
Anion gap: 15 (ref 5–15)
BUN: 57 mg/dL — ABNORMAL HIGH (ref 8–23)
CO2: 17 mmol/L — ABNORMAL LOW (ref 22–32)
Calcium: 9.1 mg/dL (ref 8.9–10.3)
Chloride: 112 mmol/L — ABNORMAL HIGH (ref 98–111)
Creatinine, Ser: 2.19 mg/dL — ABNORMAL HIGH (ref 0.61–1.24)
GFR calc Af Amer: 31 mL/min — ABNORMAL LOW (ref >60)
GFR calc non Af Amer: 27 mL/min — ABNORMAL LOW (ref >60)
Glucose, Bld: 107 mg/dL — ABNORMAL HIGH (ref 70–99)
Potassium: 4.5 mmol/L (ref 3.5–5.1)
Sodium: 144 mmol/L (ref 135–145)

## 2018-05-28 LAB — MAGNESIUM: Magnesium: 2.5 mg/dL — ABNORMAL HIGH (ref 1.7–2.4)

## 2018-05-28 MED ORDER — LORAZEPAM 2 MG/ML IJ SOLN: 1.0000 mg | Freq: Four times a day (QID) | INTRAMUSCULAR | Status: DC | PRN

## 2018-05-28 MED ORDER — ALPRAZOLAM 0.5 MG PO TABS
0.5000 mg | ORAL_TABLET | Freq: Two times a day (BID) | ORAL | Status: DC | PRN
  Administered 2018-05-28 – 2018-05-29 (×2): 0.5 mg via ORAL
  Filled 2018-05-28 (×2): qty 1

## 2018-05-28 MED ORDER — ALPRAZOLAM 0.5 MG PO TABS: 1.0000 mg | ORAL_TABLET | Freq: Every day | ORAL | Status: DC | PRN

## 2018-05-28 MED ORDER — QUETIAPINE FUMARATE 25 MG PO TABS
25.0000 mg | ORAL_TABLET | Freq: Every day | ORAL | Status: DC
  Administered 2018-05-28: 25 mg via ORAL
  Filled 2018-05-28: qty 1

## 2018-05-28 MED ORDER — FUROSEMIDE 10 MG/ML IJ SOLN
60.0000 mg | Freq: Once | INTRAMUSCULAR | Status: AC
  Administered 2018-05-28: 13:00:00 60 mg via INTRAVENOUS
  Filled 2018-05-28: qty 6

## 2018-05-28 MED ORDER — TAMSULOSIN HCL 0.4 MG PO CAPS
0.4000 mg | ORAL_CAPSULE | Freq: Every day | ORAL | Status: DC
  Administered 2018-05-28 – 2018-06-01 (×5): 0.4 mg via ORAL
  Filled 2018-05-28 (×5): qty 1

## 2018-05-28 MED ORDER — HALOPERIDOL LACTATE 5 MG/ML IJ SOLN
5.0000 mg | Freq: Once | INTRAMUSCULAR | Status: AC
  Administered 2018-05-28: 5 mg via INTRAVENOUS
  Filled 2018-05-28: qty 1

## 2018-05-28 NOTE — Progress Notes (Signed)
Physical Therapy Treatment Patient Details Name: James Harvey MRN: [id number] DOB: [birth year] Today's Date: 05/28/2018    History of Present Illness 85 y.o. male with history of atrial fibrillation on anticoagulation with Eliquis, CAD s/p PCI to mid LAD in June 2019, s/p permanent pacemaker implantation presented to the ED with worsening confusion, intermittent rectal bleeding-found to have acute kidney injury. Dx acute metabolic encephalopathy likely due to AKI, on top of underlying dementia, and UTI    PT Comments    Pt very lethargic this session and required max-total A to perform bed mobility. Also with SOB when resting, however, oxygen sats at 94% on RA. Unsafe to attempt OOB mobility this session secondary to lethargy. With decline in mobility status, pt may require SNF level therapies if unable to progress. Will continue to follow acutely and update recommendations as appropriate.    Follow Up Recommendations  Supervision/Assistance - 24 hour;Other (comment)(SNF vs HHPT)     Equipment Recommendations  None recommended by PT    Recommendations for Other Services       Precautions / Restrictions Precautions Precautions: Fall Restrictions Weight Bearing Restrictions: No    Mobility  Bed Mobility Overal bed mobility: Needs Assistance Bed Mobility: Supine to Sit;Sit to Supine     Supine to sit: Max assist;Total assist;+2 for physical assistance Sit to supine: Max assist;Total assist;+2 for physical assistance   General bed mobility comments: Max-total A +2 for bed mobility, likely secondary to lethargy. Pt requiring total A to maintain sitting balance. Pt would respond to questions when asked in sitting, however, could not understand and pt keeping eyes closed. Further mobility deferred.   Transfers                    Ambulation/Gait                 Stairs             Wheelchair Mobility    Modified Rankin (Stroke Patients Only)        Balance Overall balance assessment: Needs assistance Sitting-balance support: Bilateral upper extremity supported;Feet supported Sitting balance-Leahy Scale: Zero Sitting balance - Comments: total A to maintain sitting balance.                                     Cognition Arousal/Alertness: Lethargic Behavior During Therapy: Flat affect Overall Cognitive Status: No family/caregiver present to determine baseline cognitive functioning                                 General Comments: Pt very lethargic this session with garbled speech.       Exercises      General Comments General comments (skin integrity, edema, etc.): noted DOE when pt lying in bed; oxygen at 94% on RA.       Pertinent Vitals/Pain Pain Assessment: No/denies pain    Home Living                      Prior Function            PT Goals (current goals can now be found in the care plan section) Acute Rehab PT Goals Patient Stated Goal: get out of here PT Goal Formulation: With patient Time For Goal Achievement: 06/10/18 Potential to Achieve Goals: Fair Progress towards PT goals: Not  progressing toward goals - comment(increased lethargy)    Frequency    Min 3X/week      PT Plan Discharge plan needs to be updated    Co-evaluation              AM-PAC PT "6 Clicks" Mobility   Outcome Measure  Help needed turning from your back to your side while in a flat bed without using bedrails?: Total Help needed moving from lying on your back to sitting on the side of a flat bed without using bedrails?: Total Help needed moving to and from a bed to a chair (including a wheelchair)?: Total Help needed standing up from a chair using your arms (e.g., wheelchair or bedside chair)?: Total Help needed to walk in hospital room?: Total Help needed climbing 3-5 steps with a railing? : Total 6 Click Score: 6    End of Session Equipment Utilized During Treatment: Gait  belt Activity Tolerance: Patient limited by lethargy Patient left: in bed;with call bell/phone within reach;with bed alarm set Nurse Communication: Mobility status PT Visit Diagnosis: Unsteadiness on feet (R26.81);Other abnormalities of gait and mobility (R26.89);Muscle weakness (generalized) (M62.81);Difficulty in walking, not elsewhere classified (R26.2)     Time: 4166-0630 PT Time Calculation (min) (ACUTE ONLY): 16 min  Charges:  $Therapeutic Activity: 8-22 mins                     Leighton Ruff, PT, DPT  Acute Rehabilitation Services  Pager: [phone] Office: [phone]    Rudean Hitt 05/28/2018, 2:49 PM

## 2018-05-28 NOTE — Progress Notes (Signed)
PROGRESS NOTE        PATIENT DETAILS Name: James Harvey Age: 85 y.o. Sex: male Date of Birth: 03-Jul-1932 Admit Date: 06/14/2018 Admitting Physician Shelda Pal, DO XIP:JASNKN, Jeneen Rinks, MD  Brief Narrative: Patient is a 85 y.o. male with history of atrial fibrillation on anticoagulation with Eliquis, CAD s/p PCI to mid LAD in June 2019, s/p permanent pacemaker implantation,?  Dementia/cognitive dysfunction at baseline presented to the ED with worsening confusion, intermittent rectal bleeding-found to have acute kidney injury and admitted to the hospitalist service.  See below for further details.  Subjective: Markedly confused overnight-requiring restraints-this morning-he is wheezing somewhat-awake but confused-we will discontinue restraints this morning.  He seems to be following commands at times.  Assessment/Plan: Acute metabolic encephalopathy: Encephalopathy multifactorial-likely secondary to AKI, complicated cystitis.  CT head without any acute abnormalities.  Urine culture positive for Klebsiella-awaiting sensitivity results.  Still confused-suspect superimposed delirium-may have some amount of mild dementia/cognitive dysfunction at baseline.  Per patient son-no formal diagnosis of dementia-but apparently patient does get confused whenever he is in the hospital, but apparently patient was living by himself-and due to worsening confusion family moved in with him 2 weeks back.  AKI: Suspect mostly hemodynamically mediated-likely secondary to UTI/dehydration and the use of losartan however does have some evidence of acute urinary retention-bladder scan this morning approximately 400 cc.  Continue supportive care-stop all IV fluids-as he seems to have some amount of mild pulmonary edema this morning.  We will give 1 dose of IV Lasix-add Flomax-check frequent bladder scans and do INO catheterization.  Creatinine essentially unchanged compared to yesterday-we  will repeat electrolytes tomorrow.  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  Continue IV Rocephin-urine culture positive for Klebsiella-awaiting sensitivities.  Hematochezia: No further episodes since admission-continue Anusol x3 days.  Issues major overnight-we will start Anusol suppository x3 days significant drop in hemoglobin.  Continue Eliquis cautiously.If bleeding worsens-we will need to stop anticoagulation and consult GI.  Mild decompensated diastolic heart failure: Stop IV fluids-1 dose of IV Lasix.  PAF: Rate controlled with metoprolol-continue Eliquis cautiously for now.  After speaking with patient's primary cardiologist over the phone-have discontinued Plavix.  ChadsVasc score of 4  Minimally elevated troponin: Trend is flat-not consistent with ACS.  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 Einar Gip) on 4/2-continue to hold Plavix given hematochezia.  Continue statin  ??Dementia: Suspect has some amount of dementia at baseline-very delirious last night-starting Seroquel nightly.  Per son-patient does take Xanax-he is not sure if he takes it daily-but as needed.  Will resume Xanax-and use Ativan sparingly.   DVT Prophylaxis: Full dose anticoagulation with Eliquis  Code Status: Full code   Family Communication: Son over the phone 4/3  Disposition Plan: Remain inpatient-hopefully home with PT services.  Antimicrobial agents: Anti-infectives (From admission, onward)   Start     Dose/Rate Route Frequency Ordered Stop   05/27/18 1600  cefTRIAXone (ROCEPHIN) 1 g in sodium chloride 0.9 % 100 mL IVPB     1 g 200 mL/hr over 30 Minutes  Intravenous Every 24 hours 06/11/2018 1630  06/19/2018 1515  cefTRIAXone (ROCEPHIN) 1 g in sodium chloride 0.9 % 100 mL IVPB     1 g 200 mL/hr over 30 Minutes Intravenous  Once 06/02/2018 1511 06/08/2018 1700      Procedures: None  CONSULTS:  None  Time spent: 25- minutes-Greater than 50% of this time was spent in counseling, explanation of diagnosis, planning of further management, and coordination of care.  MEDICATIONS: Scheduled Meds:  apixaban  2.5 mg Oral BID   atorvastatin  40 mg Oral Daily   dorzolamide  1 drop Both Eyes BID   ferrous sulfate  325 mg Oral Q breakfast   furosemide  60 mg Intravenous Once   hydrocortisone  25 mg Rectal BID   latanoprost  1 drop Both Eyes QHS   metoprolol tartrate  50 mg Oral BID   pantoprazole  40 mg Oral Daily   QUEtiapine  25 mg Oral QHS   tamsulosin  0.4 mg Oral Daily   Continuous Infusions:  cefTRIAXone (ROCEPHIN)  IV 1 g (05/27/18 1736)   PRN Meds:.acetaminophen **OR** acetaminophen, ALPRAZolam, nitroGLYCERIN, ondansetron **OR** ondansetron (ZOFRAN) IV, polyethylene glycol   PHYSICAL EXAM: Vital signs: Vitals:   05/27/18 0905 05/27/18 1406 05/27/18 2044 05/28/18 1007  BP: 138/82 126/66 (!) 143/98 124/84  Pulse: 69 79 76 68  Resp:  18 (!) 22   Temp:  98.1 F (36.7 C) 97.7 F (36.5 C)   TempSrc:   Oral   SpO2:  98% 97%   Weight:      Height:       Filed Weights   06/11/2018 1848  Weight: 99.7 kg   Body mass index is 29.8 kg/m.   General appearance: Confused-but not in any distress Eyes:no scleral icterus. HEENT: Atraumatic and Normocephalic Neck: supple, no JVD. Resp: Few bibasilar rales-scattered rhonchi all over. CVS: S1 S2 regular GI: Bowel sounds present, Non tender and not distended with no gaurding, rigidity or rebound. Extremities: B/L Lower Ext shows no edema, both legs are warm to touch Neurology: Moves all 4 extremities Musculoskeletal:No digital cyanosis Skin:No Rash, warm and  dry Wounds:N/A  I have personally reviewed following labs and imaging studies  LABORATORY DATA: CBC: Recent Labs  Lab 05/31/2018 1215 05/27/18 0037 05/28/18 0937  WBC 8.8 8.6 8.4  NEUTROABS 7.0  --   --   HGB 12.3* 11.1* 11.5*  HCT 40.3 35.5* 37.6*  MCV 96.2 90.6 92.8  PLT 159 160 811    Basic Metabolic Panel: Recent Labs  Lab 05/29/2018 1215 05/27/18 0037 05/28/18 0937  NA 140 139 144  K 4.8 4.1 4.5  CL 113* 115* 112*  CO2 18* 15* 17*  GLUCOSE 85 113* 107*  BUN 62* 62* 57*  CREATININE 2.28* 2.11* 2.19*  CALCIUM 9.0 8.7* 9.1  MG  --   --  2.5*    GFR: Estimated Creatinine Clearance: 30.7 mL/min (A) (by C-G formula based on SCr of 2.19 mg/dL (H)).  Liver Function Tests: Recent Labs  Lab 06/16/2018 1215  AST 69*  ALT 43  ALKPHOS 99  BILITOT 1.9*  PROT 6.8  ALBUMIN 3.3*   No results for input(s): LIPASE, AMYLASE in the last 168 hours. No results for input(s): AMMONIA in the last 168 hours.  Coagulation Profile: Recent Labs  Lab 06/24/2018 1215  INR 2.8*    Cardiac Enzymes: Recent Labs  Lab 06/09/2018 1215 06/19/2018 1825 05/27/18 0037  TROPONINI 0.06* 0.05* 0.05*    BNP (last 3 results) No results for input(s): PROBNP in the  last 8760 hours.  HbA1C: No results for input(s): HGBA1C in the last 72 hours.  CBG: No results for input(s): GLUCAP in the last 168 hours.  Lipid Profile: No results for input(s): CHOL, HDL, LDLCALC, TRIG, CHOLHDL, LDLDIRECT in the last 72 hours.  Thyroid Function Tests: No results for input(s): TSH, T4TOTAL, FREET4, T3FREE, THYROIDAB in the last 72 hours.  Anemia Panel: No results for input(s): VITAMINB12, FOLATE, FERRITIN, TIBC, IRON, RETICCTPCT in the last 72 hours.  Urine analysis:    Component Value Date/Time   COLORURINE YELLOW 06/21/2018 1432   APPEARANCEUR CLOUDY (A) 06/19/2018 1432   LABSPEC 1.015 06/16/2018 1432   PHURINE 6.0 05/29/2018 1432   GLUCOSEU NEGATIVE 06/17/2018 1432   HGBUR SMALL (A) 06/02/2018  1432   BILIRUBINUR NEGATIVE 06/21/2018 1432   KETONESUR 15 (A) 06/05/2018 1432   PROTEINUR 30 (A) 06/02/2018 1432   UROBILINOGEN 0.2 01/12/2012 1318   NITRITE NEGATIVE 06/20/2018 1432   LEUKOCYTESUR MODERATE (A) 06/20/2018 1432    Sepsis Labs: Lactic Acid, Venous    Component Value Date/Time   LATICACIDVEN 2.46 (HH) 01/05/2017 1953    MICROBIOLOGY: Recent Results (from the past 240 hour(s))  Urine culture     Status: Abnormal   Collection Time: 05/29/2018  2:32 PM  Result Value Ref Range Status   Specimen Description URINE, CLEAN CATCH  Final   Special Requests   Final    NONE Performed at [facility], [place] [address], [place], [place] [address]    Culture >=100,000 COLONIES/mL KLEBSIELLA PNEUMONIAE (A)  Final   Report Status 05/28/2018 FINAL  Final   Organism ID, Bacteria KLEBSIELLA PNEUMONIAE (A)  Final      Susceptibility   Klebsiella pneumoniae - MIC*    AMPICILLIN >=32 RESISTANT Resistant     CEFAZOLIN <=4 SENSITIVE Sensitive     CEFTRIAXONE <=1 SENSITIVE Sensitive     CIPROFLOXACIN <=0.25 SENSITIVE Sensitive     GENTAMICIN <=1 SENSITIVE Sensitive     IMIPENEM 0.5 SENSITIVE Sensitive     NITROFURANTOIN 128 RESISTANT Resistant     TRIMETH/SULFA <=20 SENSITIVE Sensitive     AMPICILLIN/SULBACTAM 8 SENSITIVE Sensitive     PIP/TAZO <=4 SENSITIVE Sensitive     Extended ESBL NEGATIVE Sensitive     * >=100,000 COLONIES/mL KLEBSIELLA PNEUMONIAE    RADIOLOGY STUDIES/RESULTS: Dg Chest 2 View  Result Date: 06/22/2018 CLINICAL DATA:  Loss of consciousness, no chest symptoms EXAM: CHEST - 2 VIEW COMPARISON:  08/12/2017 FINDINGS: Bilateral diffuse interstitial thickening. Small bilateral pleural effusions. No pneumothorax. Stable cardiomegaly. Prior CABG. Dual lead cardiac pacemaker. No acute osseous abnormality. IMPRESSION: Findings consistent with mild CHF. Electronically Signed   By: Kathreen Devoid   On: 06/11/2018 14:39   Ct Head Wo Contrast  Result Date:  05/30/2018 CLINICAL DATA:  Altered level of consciousness. EXAM: CT HEAD WITHOUT CONTRAST TECHNIQUE: Contiguous axial images were obtained from the base of the skull through the vertex without intravenous contrast. COMPARISON:  CT scan of August 14, 2017. FINDINGS: Brain: Mild diffuse cortical atrophy is noted. Mild chronic ischemic white matter disease is noted. No mass effect or midline shift is noted. Ventricular size is within normal limits. There is no evidence of mass lesion, hemorrhage or acute infarction. Vascular: No hyperdense vessel or unexpected calcification. Skull: Normal. Negative for fracture or focal lesion. Sinuses/Orbits: No acute finding. Other: None. IMPRESSION: Mild diffuse cortical atrophy. Mild chronic ischemic white matter disease. No acute intracranial abnormality seen. Electronically Signed   By: Sabino Dick  Brooke Bonito, M.D.   On: 06/17/2018 14:18   Dg Chest Port 1v Same Day  Result Date: 05/28/2018 CLINICAL DATA:  Dyspnea EXAM: PORTABLE CHEST 1 VIEW COMPARISON:  06/16/2018 chest radiograph. FINDINGS: Stable configuration 2 lead left subclavian pacemaker and intact sternotomy wires. Stable cardiomediastinal silhouette with moderate cardiomegaly. No pneumothorax. Possible trace bilateral pleural effusions, unchanged. Stable mild pulmonary edema. IMPRESSION: 1. Persistent mild congestive heart failure, not significantly changed. 2. Possible trace bilateral pleural effusions. Electronically Signed   By: Ilona Sorrel M.D.   On: 05/28/2018 08:32   Ct Renal Stone Study  Result Date: 06/13/2018 CLINICAL DATA:  Left lower quadrant pain. EXAM: CT ABDOMEN AND PELVIS WITHOUT CONTRAST TECHNIQUE: Multidetector CT imaging of the abdomen and pelvis was performed following the standard protocol without IV contrast. COMPARISON:  CT, 01/05/2017 FINDINGS: Lower chest: Mild cardiomegaly. Small right pleural effusion. Vascular prominence and mild interstitial thickening at the lung bases. Mild dependent  subsegmental atelectasis, right lower lobe. Hepatobiliary: Liver normal in size. No mass or focal lesion. Dependent gallstones and a mostly decompressed gallbladder. No bile duct dilation. Pancreas: Unremarkable. No pancreatic ductal dilatation or surrounding inflammatory changes. Spleen: Normal in size without focal abnormality. Adrenals/Urinary Tract: No adrenal masses. Moderate to severe right renal atrophy. Left kidney normal in size. Both kidneys normal in position. Subcentimeter hyperattenuating mass projects posteriorly from the right kidney pole, consistent with a mildly complicated cyst, stable from the prior CT. No other renal masses, no stones and no hydronephrosis. Normal ureters. Bladder is mildly distended. No bladder wall thickening. There is air that tracks along the lower anterior bladder wall, which may reside within the wall. Stomach/Bowel: Stomach is unremarkable. Small bowel is normal in caliber. No wall thickening or evidence of inflammation. Colon is normal caliber. There are numerous diverticula most evident along the sigmoid colon. No evidence of diverticulitis. No colonic wall thickening or other inflammatory process. Normal appendix visualized. Vascular/Lymphatic: Mild aortic atherosclerosis. No aneurysm. No enlarged lymph nodes. Reproductive: Stable changes from a prior prostatectomy and penile implant insertion. Other: Small amount of ascites collects adjacent to the liver and spleen, tracks along the pericolic gutters and extends into the pelvic recesses. No free air. Musculoskeletal: No fracture or acute finding. Degenerative changes throughout the visualized spine and arthropathic changes of both hips. No osteoblastic or osteolytic lesions. IMPRESSION: 1. Small right pleural effusion, lung base vascular prominence and interstitial thickening and mild cardiomegaly. Findings support mild congestive heart failure if there are symptoms of shortness of breath. 2. Small amount of ascites  unclear etiology. 3. Small amount of air tracks along the anterior inferior aspect of the bladder, appearing to project within the bladder wall. There is no bladder wall thickening. No bladder masses. The etiology of this finding is unclear. Correlate with any history of recent bladder instrumentation. Emphysematous cystitis should be considered in the proper clinical setting, despite the lack of bladder wall thickening. 4. No other evidence of an acute abnormality. 5. Numerous colonic diverticula mostly along the sigmoid colon. No evidence of diverticulitis. 6. Moderate to marked right renal atrophy. 7. Aortic atherosclerosis. Electronically Signed   By: Lajean Manes M.D.   On: 06/15/2018 14:26     LOS: 1 day   Oren Binet, MD  [facility]  If 7PM-7AM, please contact night-coverage  Please page via [email or web address]  Go to [email or web address] and use [facility]'s universal password to access. If you do not have the password, please contact the hospital operator.  Locate the [facility] provider you are  looking for under [facility] and page to a number that you can be directly reached. If you still have difficulty reaching the provider, please page the [facility] (Director on Call) for the Hospitalists listed on amion for assistance.  05/28/2018, 11:32 AM

## 2018-05-28 NOTE — Progress Notes (Signed)
Restraints removed at 0800 per doctors order. There were no signs of injury upon removal. Pt is laying in bed a bit restless but trying to sleep. Will continue to monitor.

## 2018-05-28 NOTE — TOC Initial Note (Addendum)
Transition of Care [facility]) - Initial/Assessment Note    Patient Details  Name: James Harvey MRN: [id number] Date of Birth: 30-Jul-1932  [facility] CM/SW Contact:    Sharin Mons, RN Phone Number: 05/28/2018, 1:24 PM  Clinical Narrative:        Presents with acute metabolic encephalopathy.  Hx of atrial fibrillation / Eliquis, CAD s/p PCI to mid LAD in June 2019, s/p permanent pacemaker implantation,?  Dementia/cognitive dysfunction. NCM spoke with son [name] via phone. [name] stated pt has 24/7 supervision/assistance @ home. [name] and [name]'s wife live with pt, and pt has a caregiver also. Pt has wheelchair, rolling walker, rolator . [name] states once dad is d/c pt will return to home, declines SNF placement, however, agreeable to home health services. Choice list provided and [facility] was selected to provide home health services.           BRYSUN ESCHMANN [address]) [place] ([phone]       Expected Discharge Plan: [facility] [facility](Family declined SNF placement ) Barriers to Discharge: Continued Medical Work up   Patient Goals and CMS Choice Patient states their goals for this hospitalization and ongoing recovery are:: (Pt confused) CMS Medicare.gov Compare Post Acute Care list provided to:: Patient Represenative (must comment)(son) Choice offered to / list presented to : Adult Children  Expected Discharge Plan and Services Expected Discharge Plan: [place] [facility](Family declined SNF placement ) In-house Referral: NA Discharge Planning Services: CM Consult Post Acute Care Choice: NA Living arrangements for the past 2 months: Single Family Home                 DME Arranged: N/A(Pt has W/C, rolling walker, rolater) DME Agency: NA HH Arranged: PT,RN [place]: [place] ([facility])   Face to face will be needed for home health services ( PT) from MD   Prior Living  Arrangements/Services Living arrangements for the past 2 months: Single Family Home Lives with:: Adult Children Patient language and need for interpreter reviewed:: Yes Do you feel safe going back to the place where you live?: Yes(per son [name], pt confused)      Need for Family Participation in Patient Care: No (Comment) Care giver support system in place?: Yes (comment)(pt with 24/7 caregiver)   Criminal Activity/Legal Involvement Pertinent to Current Situation/Hospitalization: No - Comment as needed  Activities of Daily Living      Permission Sought/Granted Permission sought to share information with : Case Manager, Family Supports Permission granted to share information with : Yes, Verbal Permission Granted  Share Information with NAME: [name] J Tiso (Son)Julie Mcbee (Other)           Emotional Assessment Appearance:: Appears stated age Attitude/Demeanor/Rapport: (pt confused) Affect (typically observed): (pt confused) Orientation: : (pt confused ) Alcohol / Substance Use: Not Applicable Psych Involvement: No (comment)  Admission diagnosis:  Confusion [R41.0] Acute cystitis with hematuria [N30.01] AKI (acute kidney injury) ([place]) [N17.9] Patient Active Problem List   Diagnosis Date Noted  . Acute kidney injury ([place]) 05/28/2018  . Acute cystitis 06/22/2018  . Dementia ([place]) 06/03/2018  . Encephalopathy 06/09/2018  . External hemorrhoid 06/17/2018  . Elevated troponin 05/27/2018  . CKD (chronic kidney disease) stage 3, GFR 30-59 ml/min (HCC) 04/20/2018  . Hyperglycemia 04/20/2018  . Hypercholesterolemia 04/20/2018  . Paroxysmal atrial fibrillation ([place]) 08/14/2017  . S/P placement of cardiac pacemaker 08/14/2017  . Thrombocytopenia ([place]) 08/14/2017  . Delirium  08/14/2017  . Closed C2 fracture ([place]) 01/06/2017  . Bilateral hand numbness 07/15/2016  . Other spondylosis with radiculopathy, cervical region 07/15/2016  . CAD (coronary artery disease) 05/24/2010   . Atrioventricular block, complete ([place]) 05/24/2010  . Hypertension 05/24/2010   PCP:  Tamsen Roers, MD Pharmacy:   [facility], [place] - [address] [place] [address] Phone: [phone] Fax: [phone]     Social Determinants of Health (SDOH) Interventions    Readmission Risk Interventions No flowsheet data found.

## 2018-05-29 LAB — CBC
HCT: 36 % — ABNORMAL LOW (ref 39.0–52.0)
Hemoglobin: 11.3 g/dL — ABNORMAL LOW (ref 13.0–17.0)
MCH: 28.8 pg (ref 26.0–34.0)
MCHC: 31.4 g/dL (ref 30.0–36.0)
MCV: 91.8 fL (ref 80.0–100.0)
Platelets: 140 10*3/uL — ABNORMAL LOW (ref 150–400)
RBC: 3.92 MIL/uL — ABNORMAL LOW (ref 4.22–5.81)
RDW: 17.1 % — ABNORMAL HIGH (ref 11.5–15.5)
WBC: 8.1 10*3/uL (ref 4.0–10.5)
nRBC: 0.4 % — ABNORMAL HIGH (ref 0.0–0.2)

## 2018-05-29 LAB — BASIC METABOLIC PANEL
Anion gap: 14 (ref 5–15)
BUN: 65 mg/dL — ABNORMAL HIGH (ref 8–23)
CO2: 17 mmol/L — ABNORMAL LOW (ref 22–32)
Calcium: 8.9 mg/dL (ref 8.9–10.3)
Chloride: 115 mmol/L — ABNORMAL HIGH (ref 98–111)
Creatinine, Ser: 2.3 mg/dL — ABNORMAL HIGH (ref 0.61–1.24)
GFR calc Af Amer: 29 mL/min — ABNORMAL LOW (ref >60)
GFR calc non Af Amer: 25 mL/min — ABNORMAL LOW (ref >60)
Glucose, Bld: 112 mg/dL — ABNORMAL HIGH (ref 70–99)
Potassium: 4.8 mmol/L (ref 3.5–5.1)
Sodium: 146 mmol/L — ABNORMAL HIGH (ref 135–145)

## 2018-05-29 MED ORDER — SODIUM CHLORIDE 0.45 % IV SOLN
INTRAVENOUS | Status: DC
  Administered 2018-05-29: 13:00:00 via INTRAVENOUS

## 2018-05-29 MED ORDER — QUETIAPINE FUMARATE 25 MG PO TABS
50.0000 mg | ORAL_TABLET | Freq: Every day | ORAL | Status: DC
  Administered 2018-05-29: 50 mg via ORAL
  Filled 2018-05-29: qty 2

## 2018-05-29 MED ORDER — CEFAZOLIN SODIUM-DEXTROSE 1-4 GM/50ML-% IV SOLN
1.0000 g | Freq: Two times a day (BID) | INTRAVENOUS | Status: DC
  Administered 2018-05-29 – 2018-06-01 (×7): 1 g via INTRAVENOUS
  Filled 2018-05-29 (×7): qty 50

## 2018-05-29 NOTE — Progress Notes (Signed)
PROGRESS NOTE        PATIENT DETAILS Name: James Harvey Age: 85 y.o. Sex: male Date of Birth: 1932-10-21 Admit Date: 06/23/2018 Admitting Physician Shelda Pal, DO ENI:DPOEUM, Jeneen Rinks, MD  Brief Narrative: Patient is a 85 y.o. male with history of atrial fibrillation on anticoagulation with Eliquis, CAD s/p PCI to mid LAD in June 2019, s/p permanent pacemaker implantation,?  Dementia/cognitive dysfunction at baseline presented to the ED with worsening confusion, intermittent rectal bleeding-found to have acute kidney injury and admitted to the hospitalist service.  See below for further details.  Subjective: More awake-but still confused.  Nursing staff-became delirious last night again-required Haldol.  Assessment/Plan: Acute metabolic encephalopathy: Multifactorial etiology-secondary to AKI and complicated cystitis-probably on a background of dementia/cognitive dysfunction.  CT of the head without any acute abnormalities.   Per patient son-no formal diagnosis of dementia-but apparently patient does get confused whenever he is in the hospital, but apparently patient was living by himself-and due to worsening confusion family moved in with him 2 weeks back.  Unfortunately-continues to have episodes of delirium that is markedly worse at night-will increase Seroquel to 50 mg nightly.  Per son-he does take Xanax intermittently (not sure if he takes it every day)-continue to cautiously continue with Xanax/benzodiazepines as needed.  AKI: Suspect mostly hemodynamically mediated-likely secondary to UTI/dehydration,use of losartan and some evidence of intermittent acute urinary retention.  Creatinine is essentially unchanged-slightly worse today-got Lasix yesterday-we will gently hydrate for a few hours today.  Recheck electrolytes tomorrow.  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  Urine culture positive for Klebsiella pneumonia-we can narrow IV antibiotics down to Ancef.  Once oral intake is more stable-with less confusion-we can consider transitioning to oral antimicrobial therapy.  Hematochezia: No further episodes since admission-continue Anusol x3 days.  Issues major overnight-we will start Anusol suppository x3 days significant drop in hemoglobin.  Continue Eliquis cautiously.If bleeding worsens-we will need to stop anticoagulation and consult GI.  Mild decompensated diastolic heart failure: Better-received 1 dose of IV Lasix.  Continue to follow volume status closely.  PAF: Rate controlled with metoprolol-continue Eliquis cautiously for now.  After speaking with patient's primary cardiologist over the phone-have discontinued Plavix.  ChadsVasc score of 4  Minimally elevated troponin: Trend is flat-not consistent with ACS.  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 Einar Gip) on 4/2-continue to hold Plavix given hematochezia.  Continue statin  ??Dementia: Suspect has some amount of dementia at baseline-continues to have episodes of delirium very delirious last night-starting Seroquel nightly.  Per son-patient does take Xanax-he is not sure if he takes it daily-but as needed.  Will resume Xanax-and use Ativan sparingly.   DVT Prophylaxis: Full dose anticoagulation with Eliquis  Code Status: Full code   Family Communication: Son over the phone 4/3  Disposition Plan: Remain inpatient  Antimicrobial agents: Anti-infectives (From admission, onward)   Start     Dose/Rate Route Frequency Ordered Stop   05/27/18 1600  cefTRIAXone (ROCEPHIN) 1 g in sodium chloride 0.9 % 100 mL IVPB     1 g 200 mL/hr  over 30 Minutes Intravenous Every 24 hours 06/24/2018 1630     05/27/2018 1515  cefTRIAXone (ROCEPHIN) 1 g in sodium chloride 0.9 % 100 mL IVPB     1 g 200 mL/hr over 30 Minutes Intravenous  Once 06/02/2018 1511 06/03/2018 1700      Procedures: None  CONSULTS:  None  Time spent: 25- minutes-Greater than 50% of this time was spent in counseling, explanation of diagnosis, planning of further management, and coordination of care.  MEDICATIONS: Scheduled Meds:  apixaban  2.5 mg Oral BID   atorvastatin  40 mg Oral Daily   dorzolamide  1 drop Both Eyes BID   ferrous sulfate  325 mg Oral Q breakfast   latanoprost  1 drop Both Eyes QHS   metoprolol tartrate  50 mg Oral BID   pantoprazole  40 mg Oral Daily   QUEtiapine  25 mg Oral QHS   tamsulosin  0.4 mg Oral Daily   Continuous Infusions:  cefTRIAXone (ROCEPHIN)  IV Stopped (05/28/18 1659)   PRN Meds:.acetaminophen **OR** acetaminophen, ALPRAZolam, LORazepam, nitroGLYCERIN, ondansetron **OR** ondansetron (ZOFRAN) IV, polyethylene glycol   PHYSICAL EXAM: Vital signs: Vitals:   05/28/18 1007 05/28/18 1443 05/28/18 2009 05/29/18 0602  BP: 124/84 (!) 130/99 (!) 127/91 126/62  Pulse: 68 (!) 59 63 65  Resp:    18  Temp:  98.2 F (36.8 C) 98.3 F (36.8 C) 97.7 F (36.5 C)  TempSrc:  Oral Oral Oral  SpO2:  93% 97% (!) 89%  Weight:      Height:       Filed Weights   06/20/2018 1848  Weight: 99.7 kg   Body mass index is 29.8 kg/m.   General appearance: Remains confused-but more awake compared to yesterday.  Lying comfortably in bed with no distress. Eyes:no scleral icterus. HEENT: Atraumatic and Normocephalic Neck: supple, no JVD. Resp:Good air entry bilaterally, appears to have intermittent wheezing-but suspect this is mostly coming from his neck rather than his lungs. CVS: S1 S2 regular, no murmurs.  GI: Bowel sounds present, Non tender and not distended with no gaurding, rigidity or rebound. Extremities: B/L Lower Ext  shows no edema, both legs are warm to touch Neurology: Skull exam-but seems to move all 4 extremities. Musculoskeletal:No digital cyanosis Skin:No Rash, warm and dry Wounds:N/A  I have personally reviewed following labs and imaging studies  LABORATORY DATA: CBC: Recent Labs  Lab 06/09/2018 1215 05/27/18 0037 05/28/18 0937 05/29/18 0255  WBC 8.8 8.6 8.4 8.1  NEUTROABS 7.0  --   --   --   HGB 12.3* 11.1* 11.5* 11.3*  HCT 40.3 35.5* 37.6* 36.0*  MCV 96.2 90.6 92.8 91.8  PLT 159 160 168 140*    Basic Metabolic Panel: Recent Labs  Lab 06/06/2018 1215 05/27/18 0037 05/28/18 0937 05/29/18 0255  NA 140 139 144 146*  K 4.8 4.1 4.5 4.8  CL 113* 115* 112* 115*  CO2 18* 15* 17* 17*  GLUCOSE 85 113* 107* 112*  BUN 62* 62* 57* 65*  CREATININE 2.28* 2.11* 2.19* 2.30*  CALCIUM 9.0 8.7* 9.1 8.9  MG  --   --  2.5*  --     GFR: Estimated Creatinine Clearance: 29.2 mL/min (A) (by C-G formula based on SCr of 2.3 mg/dL (H)).  Liver Function Tests: Recent Labs  Lab 06/03/2018 1215  AST 69*  ALT 43  ALKPHOS 99  BILITOT 1.9*  PROT 6.8  ALBUMIN 3.3*   No results for input(s): LIPASE, AMYLASE in the last 168 hours.  No results for input(s): AMMONIA in the last 168 hours.  Coagulation Profile: Recent Labs  Lab 06/01/2018 1215  INR 2.8*    Cardiac Enzymes: Recent Labs  Lab 06/12/2018 1215 06/10/2018 1825 05/27/18 0037  TROPONINI 0.06* 0.05* 0.05*    BNP (last 3 results) No results for input(s): PROBNP in the last 8760 hours.  HbA1C: No results for input(s): HGBA1C in the last 72 hours.  CBG: No results for input(s): GLUCAP in the last 168 hours.  Lipid Profile: No results for input(s): CHOL, HDL, LDLCALC, TRIG, CHOLHDL, LDLDIRECT in the last 72 hours.  Thyroid Function Tests: No results for input(s): TSH, T4TOTAL, FREET4, T3FREE, THYROIDAB in the last 72 hours.  Anemia Panel: No results for input(s): VITAMINB12, FOLATE, FERRITIN, TIBC, IRON, RETICCTPCT in the last 72  hours.  Urine analysis:    Component Value Date/Time   COLORURINE YELLOW 06/12/2018 1432   APPEARANCEUR CLOUDY (A) 06/06/2018 1432   LABSPEC 1.015 06/05/2018 1432   PHURINE 6.0 06/05/2018 1432   GLUCOSEU NEGATIVE 05/29/2018 1432   HGBUR SMALL (A) 06/05/2018 1432   BILIRUBINUR NEGATIVE 06/06/2018 1432   KETONESUR 15 (A) 06/13/2018 1432   PROTEINUR 30 (A) 06/01/2018 1432   UROBILINOGEN 0.2 01/12/2012 1318   NITRITE NEGATIVE 06/03/2018 1432   LEUKOCYTESUR MODERATE (A) 06/06/2018 1432    Sepsis Labs: Lactic Acid, Venous    Component Value Date/Time   LATICACIDVEN 2.46 (HH) 01/05/2017 1953    MICROBIOLOGY: Recent Results (from the past 240 hour(s))  Urine culture     Status: Abnormal   Collection Time: 05/31/2018  2:32 PM  Result Value Ref Range Status   Specimen Description URINE, CLEAN CATCH  Final   Special Requests   Final    NONE Performed at [facility], [place] [address], [place], [place] [address]    Culture >=100,000 COLONIES/mL KLEBSIELLA PNEUMONIAE (A)  Final   Report Status 05/28/2018 FINAL  Final   Organism ID, Bacteria KLEBSIELLA PNEUMONIAE (A)  Final      Susceptibility   Klebsiella pneumoniae - MIC*    AMPICILLIN >=32 RESISTANT Resistant     CEFAZOLIN <=4 SENSITIVE Sensitive     CEFTRIAXONE <=1 SENSITIVE Sensitive     CIPROFLOXACIN <=0.25 SENSITIVE Sensitive     GENTAMICIN <=1 SENSITIVE Sensitive     IMIPENEM 0.5 SENSITIVE Sensitive     NITROFURANTOIN 128 RESISTANT Resistant     TRIMETH/SULFA <=20 SENSITIVE Sensitive     AMPICILLIN/SULBACTAM 8 SENSITIVE Sensitive     PIP/TAZO <=4 SENSITIVE Sensitive     Extended ESBL NEGATIVE Sensitive     * >=100,000 COLONIES/mL KLEBSIELLA PNEUMONIAE    RADIOLOGY STUDIES/RESULTS: Dg Chest 2 View  Result Date: 06/06/2018 CLINICAL DATA:  Loss of consciousness, no chest symptoms EXAM: CHEST - 2 VIEW COMPARISON:  08/12/2017 FINDINGS: Bilateral diffuse interstitial thickening. Small bilateral pleural effusions. No  pneumothorax. Stable cardiomegaly. Prior CABG. Dual lead cardiac pacemaker. No acute osseous abnormality. IMPRESSION: Findings consistent with mild CHF. Electronically Signed   By: Kathreen Devoid   On: 06/13/2018 14:39   Ct Head Wo Contrast  Result Date: 05/31/2018 CLINICAL DATA:  Altered level of consciousness. EXAM: CT HEAD WITHOUT CONTRAST TECHNIQUE: Contiguous axial images were obtained from the base of the skull through the vertex without intravenous contrast. COMPARISON:  CT scan of August 14, 2017. FINDINGS: Brain: Mild diffuse cortical atrophy is noted. Mild chronic ischemic white matter disease is noted. No mass effect or midline shift is noted. Ventricular size is within normal limits.  There is no evidence of mass lesion, hemorrhage or acute infarction. Vascular: No hyperdense vessel or unexpected calcification. Skull: Normal. Negative for fracture or focal lesion. Sinuses/Orbits: No acute finding. Other: None. IMPRESSION: Mild diffuse cortical atrophy. Mild chronic ischemic white matter disease. No acute intracranial abnormality seen. Electronically Signed   By: Marijo Conception, M.D.   On: 06/24/2018 14:18   Dg Chest Port 1v Same Day  Result Date: 05/28/2018 CLINICAL DATA:  Dyspnea EXAM: PORTABLE CHEST 1 VIEW COMPARISON:  05/31/2018 chest radiograph. FINDINGS: Stable configuration 2 lead left subclavian pacemaker and intact sternotomy wires. Stable cardiomediastinal silhouette with moderate cardiomegaly. No pneumothorax. Possible trace bilateral pleural effusions, unchanged. Stable mild pulmonary edema. IMPRESSION: 1. Persistent mild congestive heart failure, not significantly changed. 2. Possible trace bilateral pleural effusions. Electronically Signed   By: Ilona Sorrel M.D.   On: 05/28/2018 08:32   Ct Renal Stone Study  Result Date: 06/06/2018 CLINICAL DATA:  Left lower quadrant pain. EXAM: CT ABDOMEN AND PELVIS WITHOUT CONTRAST TECHNIQUE: Multidetector CT imaging of the abdomen and pelvis was  performed following the standard protocol without IV contrast. COMPARISON:  CT, 01/05/2017 FINDINGS: Lower chest: Mild cardiomegaly. Small right pleural effusion. Vascular prominence and mild interstitial thickening at the lung bases. Mild dependent subsegmental atelectasis, right lower lobe. Hepatobiliary: Liver normal in size. No mass or focal lesion. Dependent gallstones and a mostly decompressed gallbladder. No bile duct dilation. Pancreas: Unremarkable. No pancreatic ductal dilatation or surrounding inflammatory changes. Spleen: Normal in size without focal abnormality. Adrenals/Urinary Tract: No adrenal masses. Moderate to severe right renal atrophy. Left kidney normal in size. Both kidneys normal in position. Subcentimeter hyperattenuating mass projects posteriorly from the right kidney pole, consistent with a mildly complicated cyst, stable from the prior CT. No other renal masses, no stones and no hydronephrosis. Normal ureters. Bladder is mildly distended. No bladder wall thickening. There is air that tracks along the lower anterior bladder wall, which may reside within the wall. Stomach/Bowel: Stomach is unremarkable. Small bowel is normal in caliber. No wall thickening or evidence of inflammation. Colon is normal caliber. There are numerous diverticula most evident along the sigmoid colon. No evidence of diverticulitis. No colonic wall thickening or other inflammatory process. Normal appendix visualized. Vascular/Lymphatic: Mild aortic atherosclerosis. No aneurysm. No enlarged lymph nodes. Reproductive: Stable changes from a prior prostatectomy and penile implant insertion. Other: Small amount of ascites collects adjacent to the liver and spleen, tracks along the pericolic gutters and extends into the pelvic recesses. No free air. Musculoskeletal: No fracture or acute finding. Degenerative changes throughout the visualized spine and arthropathic changes of both hips. No osteoblastic or osteolytic  lesions. IMPRESSION: 1. Small right pleural effusion, lung base vascular prominence and interstitial thickening and mild cardiomegaly. Findings support mild congestive heart failure if there are symptoms of shortness of breath. 2. Small amount of ascites unclear etiology. 3. Small amount of air tracks along the anterior inferior aspect of the bladder, appearing to project within the bladder wall. There is no bladder wall thickening. No bladder masses. The etiology of this finding is unclear. Correlate with any history of recent bladder instrumentation. Emphysematous cystitis should be considered in the proper clinical setting, despite the lack of bladder wall thickening. 4. No other evidence of an acute abnormality. 5. Numerous colonic diverticula mostly along the sigmoid colon. No evidence of diverticulitis. 6. Moderate to marked right renal atrophy. 7. Aortic atherosclerosis. Electronically Signed   By: Lajean Manes M.D.   On: 06/06/2018 14:26  LOS: 2 days   Oren Binet, MD  [facility]  If 7PM-7AM, please contact night-coverage  Please page via [email or web address]  Go to [email or web address] and use [facility]'s universal password to access. If you do not have the password, please contact the hospital operator.  Locate the [facility] provider you are looking for under [facility] and page to a number that you can be directly reached. If you still have difficulty reaching the provider, please page the [facility] (Director on Call) for the Hospitalists listed on amion for assistance.  05/29/2018, 10:34 AM

## 2018-05-30 ENCOUNTER — Inpatient Hospital Stay (HOSPITAL_COMMUNITY): Payer: Medicare Other

## 2018-05-30 DIAGNOSIS — Z7189 Other specified counseling: Secondary | ICD-10-CM

## 2018-05-30 DIAGNOSIS — Z515 Encounter for palliative care: Secondary | ICD-10-CM

## 2018-05-30 LAB — BASIC METABOLIC PANEL
Anion gap: 11 (ref 5–15)
BUN: 80 mg/dL — ABNORMAL HIGH (ref 8–23)
CO2: 18 mmol/L — ABNORMAL LOW (ref 22–32)
Calcium: 8.7 mg/dL — ABNORMAL LOW (ref 8.9–10.3)
Chloride: 114 mmol/L — ABNORMAL HIGH (ref 98–111)
Creatinine, Ser: 3.03 mg/dL — ABNORMAL HIGH (ref 0.61–1.24)
GFR calc Af Amer: 21 mL/min — ABNORMAL LOW (ref >60)
GFR calc non Af Amer: 18 mL/min — ABNORMAL LOW (ref >60)
Glucose, Bld: 128 mg/dL — ABNORMAL HIGH (ref 70–99)
Potassium: 4.3 mmol/L (ref 3.5–5.1)
Sodium: 143 mmol/L (ref 135–145)

## 2018-05-30 LAB — BLOOD GAS, ARTERIAL
Acid-base deficit: 6 mmol/L — ABNORMAL HIGH (ref 0.0–2.0)
Bicarbonate: 18.7 mmol/L — ABNORMAL LOW (ref 20.0–28.0)
Drawn by: 283381
FIO2: 21
O2 Saturation: 88.8 %
Patient temperature: 98.6
pCO2 arterial: 35.7 mmHg (ref 32.0–48.0)
pH, Arterial: 7.339 — ABNORMAL LOW (ref 7.350–7.450)
pO2, Arterial: 62.4 mmHg — ABNORMAL LOW (ref 83.0–108.0)

## 2018-05-30 LAB — AMMONIA: Ammonia: 37 umol/L — ABNORMAL HIGH (ref 9–35)

## 2018-05-30 LAB — VITAMIN B12: Vitamin B-12: 1809 pg/mL — ABNORMAL HIGH (ref 180–914)

## 2018-05-30 LAB — TSH: TSH: 2.111 u[IU]/mL (ref 0.350–4.500)

## 2018-05-30 MED ORDER — LACTATED RINGERS IV SOLN
INTRAVENOUS | Status: DC
  Administered 2018-05-30 – 2018-06-03 (×5): via INTRAVENOUS

## 2018-05-30 NOTE — Plan of Care (Signed)
  Problem: Education: Goal: Knowledge of General Education information will improve Description Including pain rating scale, medication(s)/side effects and non-pharmacologic comfort measures Outcome: Not Met (add Reason)   Problem: Health Behavior/Discharge Planning: Goal: Ability to manage health-related needs will improve Outcome: Not Met (add Reason)   Problem: Clinical Measurements: Goal: Ability to maintain clinical measurements within normal limits will improve Outcome: Not Met (add Reason) Goal: Will remain free from infection Outcome: Not Met (add Reason) Goal: Diagnostic test results will improve Outcome: Not Met (add Reason) Goal: Respiratory complications will improve Outcome: Not Met (add Reason) Goal: Cardiovascular complication will be avoided Outcome: Not Met (add Reason)   Problem: Activity: Goal: Risk for activity intolerance will decrease Outcome: Not Met (add Reason)   Problem: Coping: Goal: Level of anxiety will decrease Outcome: Not Met (add Reason)   Problem: Elimination: Goal: Will not experience complications related to bowel motility Outcome: Not Met (add Reason) Goal: Will not experience complications related to urinary retention Outcome: Not Met (add Reason)   Problem: Pain Managment: Goal: General experience of comfort will improve Outcome: Not Met (add Reason)   Problem: Safety: Goal: Ability to remain free from injury will improve Outcome: Not Met (add Reason)   Problem: Skin Integrity: Goal: Risk for impaired skin integrity will decrease Outcome: Not Met (add Reason)

## 2018-05-30 NOTE — Progress Notes (Signed)
4/5 @10 :41 spoke with RN pacemaker unsafe and order to be canceled.

## 2018-05-30 NOTE — Procedures (Signed)
EEG Report  Clinical History:  Worsening mental status in patient with baseline dementia and poor renal function.  Technical Summary:  A 19 channel digital EEG recording was performed using the 10-20 international system of electrode placement.  Bipolar and Referential montages were used.  The total recording time was approx 20 minutes.  Findings:  There is no posterior dominant rhythm.  Background frequencies are about 6 Hz and symmetrical.  There is no focal slowing.  There are no epileptiform discharges or electrographic seizures.  Sleep is not recorded.  Impression:  This is an abnormal EEG.  There is evidence of moderate generalized slowing of brain activity, which is non-specific, but may be due to underlying dementia, toxic, metabolic, or infectious etiologies.  Clinical correlation is recommended.  The patient is not in non-convulsive status epilepticus.    Rogue Jury, MS, MD

## 2018-05-30 NOTE — Consult Note (Signed)
Consultation Note Date: 05/30/2018   Patient Name: James Harvey  DOB: 1932/05/17  MRN: [id number]  Age / Sex: 85 y.o., male  PCP: James Roers, MD Referring Physician: Jonetta Osgood, MD  Reason for Consultation: Establishing goals of care and Psychosocial/spiritual support  HPI/Patient Profile: 85 y.o. male  with past medical history of coronary artery disease with history of CABG 2011, pacemaker, atrial fib on anticoagulation, congestive heart failure, dementia, hypertension, anxiety, chronic kidney disease stage III, , hyperlipidemia, history of prostate cancer admitted on 06/14/2018 with altered mental status.  Patient has been very confused in the hospital setting with a history of hospital delirium.  Currently has a Air cabin crew at the bedside and is alert.  Previously, he has been almost obtunded  Consult ordered for goals of care.   Clinical Assessment and Goals of Care: Patient seen, chart reviewed.  Patient is alert but very confused.  He is oriented only to himself.  It appears as though he still views himself as needing to go to work.  He has a Air cabin crew because of his high fall risk, secondary to confusion.  Prior to this, patient has had a waxing and waning mental status from appearing agitated to almost obtunded.  His agitation was treated on an as-needed basis with Haldol Ativan and Seroquel at bedtime.  All sedating medications have been stopped.  He is more alert today but very confused.  He is unable to participate in goals of care discussion.  Per bedside sitter, patient did not eat any breakfast and ate approximately 25% of his lunch and had to be fed.  In addition to altered mental status, patient presented with acute kidney injury with an initial creatinine of 2.28 (baseline noted as 1.2).  Despite IV fluids and supportive care, his creatinine has continued to rise.  As of 05/30/2018,  creatinine 3.03.  CTh did not show any acute abnormalities but did reflect mild chronic chronic, diffuse atrophy in keeping with his medical history of dementia  Patient is unable to participate in goals of care.  Per chart review, his son, James Harvey as well as his wife James Harvey have been actively involved in his care.  Their  mobile numbers are, [name] at [phone]; James Harvey [phone]. I have left messages with both [name] as well as James Harvey  Due to COVID-19, families have been unable to visit and goals of care conversation attempted via phone.    SUMMARY OF RECOMMENDATIONS   Patient is currently a full code My recommendation would be to continue to treat the treatable, with care limits of DNR  secondary to multiple comorbidities, multisystem disease including chronic kidney disease with creatinine worsening, dementia, coronary artery disease as well as atrial fib Palliative medicine to continue to reach out to family and engage in goals of care Code Status/Advance Care Planning:  Full code   Palliative Prophylaxis:   Aspiration, Bowel Regimen, Delirium Protocol, Eye Care, Frequent Pain Assessment, Oral Care and Turn Reposition  Additional Recommendations (Limitations, Scope, Preferences):  Full Scope Treatment  Psycho-social/Spiritual:   Desire for further Chaplaincy support:no  Additional Recommendations: Referral to Community Resources   Prognosis:   Unable to determine  Discharge Planning: To Be Determined      Primary Diagnoses: Present on Admission: . CAD (coronary artery disease) . Atrioventricular block, complete ([place]) . Hypertension . Paroxysmal atrial fibrillation ([facility]) . Acute kidney injury ([place]) . Acute cystitis . Dementia ([place]) . Encephalopathy . External hemorrhoid . Elevated troponin   I have reviewed the medical record, interviewed the patient and family, and examined the patient. The following aspects are pertinent.  Past Medical History:   Diagnosis Date  . Anxiety   . Atrioventricular block, complete ([place]) 02/19/10   s/p PPM by JA  . CAD (coronary artery disease)    s/p CABG 02/15/2010 (single vessel)  . CKD (chronic kidney disease) stage 3, GFR 30-59 ml/min ([place]) 04/20/2018  . Encounter for care of pacemaker 04/30/2018  . Hypercholesterolemia 04/20/2018  . Hyperglycemia 04/20/2018  . Hypertension   . Prostate cancer ([place])   . Unstable angina ([place]) 08/12/2017   Social History   Socioeconomic History  . Marital status: Divorced    Spouse name: Not on file  . Number of children: Not on file  . Years of education: Not on file  . Highest education level: Not on file  Occupational History  . Not on file  Social Needs  . Financial resource strain: Not on file  . Food insecurity:    Worry: Not on file    Inability: Not on file  . Transportation needs:    Medical: Not on file    Non-medical: Not on file  Tobacco Use  . Smoking status: Never Smoker  . Smokeless tobacco: Never Used  Substance and Sexual Activity  . Alcohol use: Yes  . Drug use: No  . Sexual activity: Not on file  Lifestyle  . Physical activity:    Days per week: Not on file    Minutes per session: Not on file  . Stress: Not on file  Relationships  . Social connections:    Talks on phone: Not on file    Gets together: Not on file    Attends religious service: Not on file    Active member of club or organization: Not on file    Attends meetings of clubs or organizations: Not on file    Relationship status: Not on file  Other Topics Concern  . Not on file  Social History Narrative  . Not on file   Family History  Problem Relation Age of Onset  . Hypertension Other    Scheduled Meds: . apixaban  2.5 mg Oral BID  . atorvastatin  40 mg Oral Daily  . dorzolamide  1 drop Both Eyes BID  . ferrous sulfate  325 mg Oral Q breakfast  . latanoprost  1 drop Both Eyes QHS  . metoprolol tartrate  50 mg Oral BID  . pantoprazole  40 mg Oral Daily  .  tamsulosin  0.4 mg Oral Daily   Continuous Infusions: .  ceFAZolin (ANCEF) IV 1 g (05/30/18 1335)  . lactated ringers 75 mL/hr at 05/30/18 1335   PRN Meds:.acetaminophen **OR** acetaminophen, nitroGLYCERIN, ondansetron **OR** ondansetron (ZOFRAN) IV, polyethylene glycol Medications Prior to Admission:  Prior to Admission medications   Medication Sig Start Date End Date Taking? Authorizing Provider  ALPRAZolam Duanne Moron) 1 MG tablet Take 1 mg daily as needed by mouth for anxiety.   Yes [provider]  apixaban Arne Cleveland) 5  MG TABS tablet Take 1 tablet (5 mg total) by mouth 2 (two) times daily. 04/20/18  Yes Adrian Prows, MD  Ascorbic Acid (VITAMIN C PO) Take 1 capsule daily by mouth.   Yes [provider]  atorvastatin (LIPITOR) 40 MG tablet Take 40 mg daily by mouth.   Yes [provider]  Cyanocobalamin (VITAMIN B-12 PO) Take 1 tablet daily by mouth.   Yes [provider]  dorzolamide (TRUSOPT) 2 % ophthalmic solution Place 1 drop into both eyes 2 (two) times daily. 05/03/18 05/03/19 Yes [provider]  ferrous sulfate 325 (65 FE) MG tablet Take 325 mg daily with breakfast by mouth.   Yes [provider]  losartan (COZAAR) 50 MG tablet Take 50 mg daily by mouth.   Yes [provider]  metoprolol tartrate (LOPRESSOR) 50 MG tablet Take 50 mg 2 (two) times daily by mouth.   Yes [provider]  Multiple Vitamin (MULTIVITAMIN) tablet Take 1 tablet by mouth daily.     Yes [provider]  nitroGLYCERIN (NITROSTAT) 0.4 MG SL tablet Place 1 tablet (0.4 mg total) under the tongue every 5 (five) minutes x 3 doses as needed for chest pain. 08/14/17  Yes Patwardhan, Manish J, MD  omeprazole (PRILOSEC) 40 MG capsule Take 40 mg daily by mouth.   Yes [provider]  Travoprost, BAK Harvey, (TRAVATAN Z) 0.004 % SOLN ophthalmic solution Place 1 drop into both eyes at bedtime. 07/27/17  Yes [provider]  clopidogrel  (PLAVIX) 75 MG tablet Take 1 tablet (75 mg total) by mouth daily. Patient not taking: Reported on 06/05/2018 08/15/17   Nigel Mormon, MD   No Known Allergies Review of Systems  Unable to perform ROS: Mental status change    Physical Exam Vitals signs and nursing note reviewed.  HENT:     Nose: Nose normal.  Neck:     Musculoskeletal: Normal range of motion.  Cardiovascular:     Rate and Rhythm: Normal rate.  Pulmonary:     Comments: No shortness of breath at rest and with conversation noted Genitourinary:    Comments: Foley catheter Skin:    General: Skin is warm and dry.  Neurological:     Mental Status: He is alert.     Comments: Oriented to self only  Psychiatric:     Comments: Confused, thought processes disorganized, flight of ideas     Vital Signs: BP (!) 101/46 (BP Location: Left Arm)   Pulse (!) 51   Temp 98.2 F (36.8 C) (Oral)   Resp (!) 22   Ht 6' (1.829 m)   Wt 99.7 kg   SpO2 90%   BMI 29.80 kg/m  Pain Scale: 0-10   Pain Score: 0-No pain   SpO2: SpO2: 90 % O2 Device:SpO2: 90 % O2 Flow Rate: .   IO: Intake/output summary:   Intake/Output Summary (Last 24 hours) at 05/30/2018 1402 Last data filed at 05/30/2018 1256 Gross per 24 hour  Intake 981.84 ml  Output 800 ml  Net 181.84 ml    LBM: Last BM Date: 05/27/18 Baseline Weight: Weight: 99.7 kg Most recent weight: Weight: 99.7 kg     Palliative Assessment/Data:   Flowsheet Rows     Most Recent Value  Intake Tab  Referral Department  Hospitalist  Unit at Time of Referral  Med/Surg Unit  Palliative Care Primary Diagnosis  Sepsis/Infectious Disease  Date Notified  05/30/18  Reason for referral  Clarify Goals of Care, Psychosocial or  Spiritual support  Date of Admission  06/23/2018  Date first seen by Palliative Care  05/30/18  # of days Palliative referral response time  0 Day(s)  # of days IP prior to Palliative referral  4  Clinical Assessment  Palliative Performance Scale Score  40%   Pain Max last 24 hours  Not able to report  Pain Min Last 24 hours  Not able to report  Dyspnea Max Last 24 Hours  Not able to report  Dyspnea Min Last 24 hours  Not able to report  Nausea Max Last 24 Hours  Not able to report  Nausea Min Last 24 Hours  Not able to report  Anxiety Max Last 24 Hours  Not able to report  Anxiety Min Last 24 Hours  Not able to report  Other Max Last 24 Hours  Not able to report  Psychosocial & Spiritual Assessment  Palliative Care Outcomes  Patient/Family meeting held?  Yes  Who was at the meeting?  pt, DIL  Palliative Care Outcomes  Provided psychosocial or spiritual support  Palliative Care follow-up planned  Yes, Facility      Time In: 1330 Time Out: 1400 Time Total: 30 min Greater than 50%  of this time was spent counseling and coordinating care related to the above assessment and plan.  Signed by: Dory Horn, NP   Please contact Palliative Medicine Team phone at [phone] for questions and concerns.  For individual provider: See Shea Evans

## 2018-05-30 NOTE — Progress Notes (Signed)
EEG Completed; Results Pending  

## 2018-05-30 NOTE — Progress Notes (Addendum)
PROGRESS NOTE        PATIENT DETAILS Name: James Harvey Age: 85 y.o. Sex: male Date of Birth: 08/06/1932 Admit Date: 06/05/2018 Admitting Physician Shelda Pal, DO SJG:GEZMOQ, Jeneen Rinks, MD  Brief Narrative: Patient is a 85 y.o. male with history of atrial fibrillation on anticoagulation with Eliquis, CAD s/p PCI to mid LAD in June 2019, s/p permanent pacemaker implantation,?  Dementia/cognitive dysfunction at baseline presented to the ED with worsening confusion, intermittent rectal bleeding-found to have acute kidney injury and admitted to the hospitalist service.  See below for further details.  Subjective: More awake this morning-still confused-but actually did speak to me briefly.  But later this morning-again very difficult to arouse.  Per RN-patient did not get any Haldol or benzodiazepines overnight.  He was started on Seroquel a few days back.  Assessment/Plan: Acute metabolic encephalopathy: Felt to be multifactorial etiology-secondary to worsening renal function, complicated UTI-with some suspicion that this is in the background of cognitive dysfunction/dementia.  CT head without any abnormalities.  Unfortunately continues to be confused-and lethargic at times-we will go ahead and discontinue Seroquel today.  No longer on Haldol or any sort of benzodiazepines as well.  Since continues to have persistent encephalopathy-we will go ahead and check MRI brain, ABG.  Have also ordered vitamin B12, RPR and TSH.  EEG has also been ordered.  Hopefully with supportive care he will continue to improve-if not we can consider further work-up and neurology evaluation.  Did speak with patient's son over the past few days-apparently patient does get delirium whenever he is in the hospital-son does not know of any formal diagnosis of dementia-apparently the family started living with him only 2 weeks ago as he started getting confused.  Per son, patient takes Xanax  intermittently-he is not sure if patient takes it on a daily basis.  Addendum: Unable to do MRI as patient has a PPM-we will repeat CT head.  AKI: Suspect mostly hemodynamically mediated-likely secondary to UTI/dehydration,use of losartan and some evidence of intermittent acute urinary retention.  He has had some episodes of mild/intermittent urinary retention requiring in and out catheterization-however no hydronephrosis seen on CT abdomen/CT renal stone study.  In spite of IV fluid hydration -holding all nephrotoxic agents and supportive care-creatinine continues to worsen.  Check renal ultrasound-we will discuss with nephrology over the phone.   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  Urine culture positive for Klebsiella pneumonia-IV antibiotics has been narrowed to Ancef-we will plan on a total of 5/7 days of treatment.   Hematochezia: No further episodes since admission-completed Anusol x3 days.  Hemoglobin stable.  Eliquis is been continued cautiously.  Plans are to follow-if hematochezia reoccurs-we will then consult GI formally.  Mild decompensated diastolic heart failure: Volume status is stable-follow for now.  Not on any diuretics at this time.   PAF: Rate controlled with metoprolol-continue Eliquis cautiously for now.  After speaking with patient's primary cardiologist over the phone-have discontinued Plavix.  ChadsVasc score of 4  Minimally elevated troponin: Trend is flat-not consistent with ACS.  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 Einar Gip) on 4/2-continue to hold Plavix given hematochezia.  Continue statin  ??Dementia: Suspect has  some amount of dementia at baseline-continues to have episodes of delirium-Per nursing staff-delirium is mostly worse at night-I have discontinued Haldol, benzodiazepine and have placed him with a bedside sitter. Given persistent lethargy this morning-we will go and stop Seroquel and try and manage with just a bedside sitter.  Have ordered MRI brain and other work-up-see above.  We will continue at attempts to minimize sedating agents as much as possible.  Palliative care: Elderly gentleman-who probably has some amount of dementia-may not be very evident that she lives at home-presented with AKI, kidney injury and worsening confusion for the past 2 weeks prior to this hospital stay-in spite supportive care-renal function continues to worsen, he is still very confused.  Spoke to son over the phone-full code for now-but I think it is prudent to start involving palliative care for goals of care discussion.  DVT Prophylaxis: Full dose anticoagulation with Eliquis  Code Status: Full code   Family Communication: Son over the phone 4/5  Disposition Plan: Remain inpatient  Antimicrobial agents: Anti-infectives (From admission, onward)   Start     Dose/Rate Route Frequency Ordered Stop   05/29/18 1100  ceFAZolin (ANCEF) IVPB 1 g/50 mL premix     1 g 100 mL/hr over 30 Minutes Intravenous Every 12 hours 05/29/18 1046     05/27/18 1600  cefTRIAXone (ROCEPHIN) 1 g in sodium chloride 0.9 % 100 mL IVPB  Status:  Discontinued     1 g 200 mL/hr over 30 Minutes Intravenous Every 24 hours 06/22/2018 1630 05/29/18 1046   06/12/2018 1515  cefTRIAXone (ROCEPHIN) 1 g in sodium chloride 0.9 % 100 mL IVPB     1 g 200 mL/hr over 30 Minutes Intravenous  Once 06/17/2018 1511 06/21/2018 1700      Procedures: None  CONSULTS:  None  Time spent: 25- minutes-Greater than 50% of this time was spent in counseling, explanation of diagnosis, planning of further management, and coordination of  care.  MEDICATIONS: Scheduled Meds:  apixaban  2.5 mg Oral BID   atorvastatin  40 mg Oral Daily   dorzolamide  1 drop Both Eyes BID   ferrous sulfate  325 mg Oral Q breakfast   latanoprost  1 drop Both Eyes QHS   metoprolol tartrate  50 mg Oral BID   pantoprazole  40 mg Oral Daily   tamsulosin  0.4 mg Oral Daily   Continuous Infusions:   ceFAZolin (ANCEF) IV Stopped (05/30/18 0813)   lactated ringers     PRN Meds:.acetaminophen **OR** acetaminophen, nitroGLYCERIN, ondansetron **OR** ondansetron (ZOFRAN) IV, polyethylene glycol   PHYSICAL EXAM: Vital signs: Vitals:   05/29/18 1441 05/29/18 1600 05/29/18 2159 05/30/18 0510  BP: 128/74  (!) 143/76 (!) 101/46  Pulse: 60  62 (!) 51  Resp: (!) 26 20 (!) 24 (!) 22  Temp: 97.8 F (36.6 C)  98.5 F (36.9 C) 98.2 F (36.8 C)  TempSrc: Oral  Axillary Oral  SpO2: 93%  97% 90%  Weight:      Height:       Filed Weights   06/07/2018 1848  Weight: 99.7 kg   Body mass index is 29.8 kg/m.   General appearance: Earlier when I saw him this morning-he was much more awake-after he spoke to me and his deep voice.  Still somewhat confused-but later this morning-he is much more lethargic and hard to arouse-would briefly open eyes to  a sternal rub/verbal stimuli and go back to sleep. Eyes:no scleral icterus. HEENT: Atraumatic and Normocephalic Neck: supple Resp:Good air entry bilaterally, some transmitted upper airway sounds CVS: S1 S2 regular, no murmurs.  GI: Bowel sounds present, Non tender and not distended with no gaurding, rigidity or rebound. Extremities: B/L Lower Ext shows no edema, both legs are warm to touch Neurology: Difficult exam but seems to be moving all 4 extremities. Musculoskeletal:No digital cyanosis Skin:No Rash, warm and dry Wounds:N/A  I have personally reviewed following labs and imaging studies  LABORATORY DATA: CBC: Recent Labs  Lab 06/05/2018 1215 05/27/18 0037 05/28/18 0937 05/29/18 0255  WBC  8.8 8.6 8.4 8.1  NEUTROABS 7.0  --   --   --   HGB 12.3* 11.1* 11.5* 11.3*  HCT 40.3 35.5* 37.6* 36.0*  MCV 96.2 90.6 92.8 91.8  PLT 159 160 168 140*    Basic Metabolic Panel: Recent Labs  Lab 06/13/2018 1215 05/27/18 0037 05/28/18 0937 05/29/18 0255 05/30/18 0548  NA 140 139 144 146* 143  K 4.8 4.1 4.5 4.8 4.3  CL 113* 115* 112* 115* 114*  CO2 18* 15* 17* 17* 18*  GLUCOSE 85 113* 107* 112* 128*  BUN 62* 62* 57* 65* 80*  CREATININE 2.28* 2.11* 2.19* 2.30* 3.03*  CALCIUM 9.0 8.7* 9.1 8.9 8.7*  MG  --   --  2.5*  --   --     GFR: Estimated Creatinine Clearance: 22.2 mL/min (A) (by C-G formula based on SCr of 3.03 mg/dL (H)).  Liver Function Tests: Recent Labs  Lab 06/02/2018 1215  AST 69*  ALT 43  ALKPHOS 99  BILITOT 1.9*  PROT 6.8  ALBUMIN 3.3*   No results for input(s): LIPASE, AMYLASE in the last 168 hours. No results for input(s): AMMONIA in the last 168 hours.  Coagulation Profile: Recent Labs  Lab 06/17/2018 1215  INR 2.8*    Cardiac Enzymes: Recent Labs  Lab 05/30/2018 1215 06/05/2018 1825 05/27/18 0037  TROPONINI 0.06* 0.05* 0.05*    BNP (last 3 results) No results for input(s): PROBNP in the last 8760 hours.  HbA1C: No results for input(s): HGBA1C in the last 72 hours.  CBG: No results for input(s): GLUCAP in the last 168 hours.  Lipid Profile: No results for input(s): CHOL, HDL, LDLCALC, TRIG, CHOLHDL, LDLDIRECT in the last 72 hours.  Thyroid Function Tests: No results for input(s): TSH, T4TOTAL, FREET4, T3FREE, THYROIDAB in the last 72 hours.  Anemia Panel: No results for input(s): VITAMINB12, FOLATE, FERRITIN, TIBC, IRON, RETICCTPCT in the last 72 hours.  Urine analysis:    Component Value Date/Time   COLORURINE YELLOW 06/02/2018 1432   APPEARANCEUR CLOUDY (A) 06/12/2018 1432   LABSPEC 1.015 06/08/2018 1432   PHURINE 6.0 06/03/2018 1432   GLUCOSEU NEGATIVE 05/31/2018 1432   HGBUR SMALL (A) 06/22/2018 1432   BILIRUBINUR NEGATIVE  05/30/2018 1432   KETONESUR 15 (A) 06/17/2018 1432   PROTEINUR 30 (A) 06/21/2018 1432   UROBILINOGEN 0.2 01/12/2012 1318   NITRITE NEGATIVE 06/10/2018 1432   LEUKOCYTESUR MODERATE (A) 05/31/2018 1432    Sepsis Labs: Lactic Acid, Venous    Component Value Date/Time   LATICACIDVEN 2.46 (HH) 01/05/2017 1953    MICROBIOLOGY: Recent Results (from the past 240 hour(s))  Urine culture     Status: Abnormal   Collection Time: 05/30/2018  2:32 PM  Result Value Ref Range Status   Specimen Description URINE, CLEAN CATCH  Final   Special Requests   Final  NONE Performed at [facility], [place] [address], [place], [place] [address]    Culture >=100,000 COLONIES/mL KLEBSIELLA PNEUMONIAE (A)  Final   Report Status 05/28/2018 FINAL  Final   Organism ID, Bacteria KLEBSIELLA PNEUMONIAE (A)  Final      Susceptibility   Klebsiella pneumoniae - MIC*    AMPICILLIN >=32 RESISTANT Resistant     CEFAZOLIN <=4 SENSITIVE Sensitive     CEFTRIAXONE <=1 SENSITIVE Sensitive     CIPROFLOXACIN <=0.25 SENSITIVE Sensitive     GENTAMICIN <=1 SENSITIVE Sensitive     IMIPENEM 0.5 SENSITIVE Sensitive     NITROFURANTOIN 128 RESISTANT Resistant     TRIMETH/SULFA <=20 SENSITIVE Sensitive     AMPICILLIN/SULBACTAM 8 SENSITIVE Sensitive     PIP/TAZO <=4 SENSITIVE Sensitive     Extended ESBL NEGATIVE Sensitive     * >=100,000 COLONIES/mL KLEBSIELLA PNEUMONIAE    RADIOLOGY STUDIES/RESULTS: Dg Chest 2 View  Result Date: 06/02/2018 CLINICAL DATA:  Loss of consciousness, no chest symptoms EXAM: CHEST - 2 VIEW COMPARISON:  08/12/2017 FINDINGS: Bilateral diffuse interstitial thickening. Small bilateral pleural effusions. No pneumothorax. Stable cardiomegaly. Prior CABG. Dual lead cardiac pacemaker. No acute osseous abnormality. IMPRESSION: Findings consistent with mild CHF. Electronically Signed   By: Kathreen Devoid   On: 06/16/2018 14:39   Ct Head Wo Contrast  Result Date: 06/14/2018 CLINICAL DATA:  Altered level of  consciousness. EXAM: CT HEAD WITHOUT CONTRAST TECHNIQUE: Contiguous axial images were obtained from the base of the skull through the vertex without intravenous contrast. COMPARISON:  CT scan of August 14, 2017. FINDINGS: Brain: Mild diffuse cortical atrophy is noted. Mild chronic ischemic white matter disease is noted. No mass effect or midline shift is noted. Ventricular size is within normal limits. There is no evidence of mass lesion, hemorrhage or acute infarction. Vascular: No hyperdense vessel or unexpected calcification. Skull: Normal. Negative for fracture or focal lesion. Sinuses/Orbits: No acute finding. Other: None. IMPRESSION: Mild diffuse cortical atrophy. Mild chronic ischemic white matter disease. No acute intracranial abnormality seen. Electronically Signed   By: Marijo Conception, M.D.   On: 06/18/2018 14:18   Dg Chest Port 1v Same Day  Result Date: 05/28/2018 CLINICAL DATA:  Dyspnea EXAM: PORTABLE CHEST 1 VIEW COMPARISON:  06/02/2018 chest radiograph. FINDINGS: Stable configuration 2 lead left subclavian pacemaker and intact sternotomy wires. Stable cardiomediastinal silhouette with moderate cardiomegaly. No pneumothorax. Possible trace bilateral pleural effusions, unchanged. Stable mild pulmonary edema. IMPRESSION: 1. Persistent mild congestive heart failure, not significantly changed. 2. Possible trace bilateral pleural effusions. Electronically Signed   By: Ilona Sorrel M.D.   On: 05/28/2018 08:32   Ct Renal Stone Study  Result Date: 06/22/2018 CLINICAL DATA:  Left lower quadrant pain. EXAM: CT ABDOMEN AND PELVIS WITHOUT CONTRAST TECHNIQUE: Multidetector CT imaging of the abdomen and pelvis was performed following the standard protocol without IV contrast. COMPARISON:  CT, 01/05/2017 FINDINGS: Lower chest: Mild cardiomegaly. Small right pleural effusion. Vascular prominence and mild interstitial thickening at the lung bases. Mild dependent subsegmental atelectasis, right lower lobe.  Hepatobiliary: Liver normal in size. No mass or focal lesion. Dependent gallstones and a mostly decompressed gallbladder. No bile duct dilation. Pancreas: Unremarkable. No pancreatic ductal dilatation or surrounding inflammatory changes. Spleen: Normal in size without focal abnormality. Adrenals/Urinary Tract: No adrenal masses. Moderate to severe right renal atrophy. Left kidney normal in size. Both kidneys normal in position. Subcentimeter hyperattenuating mass projects posteriorly from the right kidney pole, consistent with a mildly complicated cyst, stable  from the prior CT. No other renal masses, no stones and no hydronephrosis. Normal ureters. Bladder is mildly distended. No bladder wall thickening. There is air that tracks along the lower anterior bladder wall, which may reside within the wall. Stomach/Bowel: Stomach is unremarkable. Small bowel is normal in caliber. No wall thickening or evidence of inflammation. Colon is normal caliber. There are numerous diverticula most evident along the sigmoid colon. No evidence of diverticulitis. No colonic wall thickening or other inflammatory process. Normal appendix visualized. Vascular/Lymphatic: Mild aortic atherosclerosis. No aneurysm. No enlarged lymph nodes. Reproductive: Stable changes from a prior prostatectomy and penile implant insertion. Other: Small amount of ascites collects adjacent to the liver and spleen, tracks along the pericolic gutters and extends into the pelvic recesses. No free air. Musculoskeletal: No fracture or acute finding. Degenerative changes throughout the visualized spine and arthropathic changes of both hips. No osteoblastic or osteolytic lesions. IMPRESSION: 1. Small right pleural effusion, lung base vascular prominence and interstitial thickening and mild cardiomegaly. Findings support mild congestive heart failure if there are symptoms of shortness of breath. 2. Small amount of ascites unclear etiology. 3. Small amount of air  tracks along the anterior inferior aspect of the bladder, appearing to project within the bladder wall. There is no bladder wall thickening. No bladder masses. The etiology of this finding is unclear. Correlate with any history of recent bladder instrumentation. Emphysematous cystitis should be considered in the proper clinical setting, despite the lack of bladder wall thickening. 4. No other evidence of an acute abnormality. 5. Numerous colonic diverticula mostly along the sigmoid colon. No evidence of diverticulitis. 6. Moderate to marked right renal atrophy. 7. Aortic atherosclerosis. Electronically Signed   By: Lajean Manes M.D.   On: 06/17/2018 14:26     LOS: 3 days   Oren Binet, MD  [facility]  If 7PM-7AM, please contact night-coverage  Please page via [email or web address]  Go to [email or web address] and use [place]'s universal password to access. If you do not have the password, please contact the hospital operator.  Locate the [facility] provider you are looking for under [facility] and page to a number that you can be directly reached. If you still have difficulty reaching the provider, please page the [facility] (Director on Call) for the Hospitalists listed on amion for assistance.  05/30/2018, 10:21 AM

## 2018-05-31 LAB — CBC
HCT: 36.8 % — ABNORMAL LOW (ref 39.0–52.0)
Hemoglobin: 11.5 g/dL — ABNORMAL LOW (ref 13.0–17.0)
MCH: 28.6 pg (ref 26.0–34.0)
MCHC: 31.3 g/dL (ref 30.0–36.0)
MCV: 91.5 fL (ref 80.0–100.0)
Platelets: 89 K/uL — ABNORMAL LOW (ref 150–400)
RBC: 4.02 MIL/uL — ABNORMAL LOW (ref 4.22–5.81)
RDW: 17.2 % — ABNORMAL HIGH (ref 11.5–15.5)
WBC: 10.4 K/uL (ref 4.0–10.5)
nRBC: 0 % (ref 0.0–0.2)

## 2018-05-31 LAB — COMPREHENSIVE METABOLIC PANEL WITH GFR
ALT: 36 U/L (ref 0–44)
AST: 63 U/L — ABNORMAL HIGH (ref 15–41)
Albumin: 2.9 g/dL — ABNORMAL LOW (ref 3.5–5.0)
Alkaline Phosphatase: 92 U/L (ref 38–126)
Anion gap: 12 (ref 5–15)
BUN: 81 mg/dL — ABNORMAL HIGH (ref 8–23)
CO2: 19 mmol/L — ABNORMAL LOW (ref 22–32)
Calcium: 8.8 mg/dL — ABNORMAL LOW (ref 8.9–10.3)
Chloride: 114 mmol/L — ABNORMAL HIGH (ref 98–111)
Creatinine, Ser: 2.84 mg/dL — ABNORMAL HIGH (ref 0.61–1.24)
GFR calc Af Amer: 23 mL/min — ABNORMAL LOW
GFR calc non Af Amer: 19 mL/min — ABNORMAL LOW
Glucose, Bld: 117 mg/dL — ABNORMAL HIGH (ref 70–99)
Potassium: 4.2 mmol/L (ref 3.5–5.1)
Sodium: 145 mmol/L (ref 135–145)
Total Bilirubin: 1.3 mg/dL — ABNORMAL HIGH (ref 0.3–1.2)
Total Protein: 6.2 g/dL — ABNORMAL LOW (ref 6.5–8.1)

## 2018-05-31 LAB — SYPHILIS: RPR W/REFLEX TO RPR TITER AND TREPONEMAL ANTIBODIES, TRADITIONAL SCREENING AND DIAGNOSIS ALGORITHM: RPR Ser Ql: NONREACTIVE

## 2018-05-31 NOTE — Progress Notes (Signed)
PROGRESS NOTE        PATIENT DETAILS Name: James Harvey Age: 85 y.o. Sex: male Date of Birth: 15-Jun-1932 Admit Date: 06/10/2018 Admitting Physician Shelda Pal, DO BWG:YKZLDJ, Jeneen Rinks, MD  Brief Narrative: Patient is a 85 y.o. male with history of atrial fibrillation on anticoagulation with Eliquis, CAD s/p PCI to mid LAD in June 2019, s/p permanent pacemaker implantation,?  Dementia/cognitive dysfunction at baseline presented to the ED with worsening confusion, intermittent rectal bleeding-found to have acute kidney injury and admitted to the hospitalist service.  See below for further details.  Subjective: Dramatically better-completely awake-still confused but much improved compared to yesterday.  Assessment/Plan: Acute metabolic encephalopathy: Felt to be multifactorial etiology-secondary to worsening renal function, complicated UTI-with some suspicion that this is in the background of cognitive dysfunction/dementia.  CT head without any abnormalities.  Hospital course complicated by delirium-which was likely secondary to sundowning-but also worsened by the use of antipsychotics, sedatives.  All antipsychotic/sedatives were discontinued-a bedside sitter was placed-this morning he is markedly better-still somewhat confused but much better than the past few days.  CT head x2- for acute abnormalities-unable to perform MRI due to PPM.  EEG without seizures.  Vitamin B12 within normal limits, RPR negative.  Suspect has significant amount of dementia at baseline-may not be evident as he lives at home and in for a familiar surroundings.  Will probably require outpatient neurology evaluation at some point..  Did speak with patient's son over the past few days-apparently patient does get delirium whenever he is in the hospital-son does not know of any formal diagnosis of dementia-apparently the family started living with him only 2 weeks ago as he started getting  confused.  Per son, patient takes Xanax intermittently-he is not sure if patient takes it on a daily basis.  AKI: The factorial-likely secondary to UTI/dehydration and intermittent urinary retention.  Has required in and out catheterizations intermittently.  Per nursing staff-did not require catheterization last night.  Continue to treat UTI with antimicrobial therapy-continue to hold losartan-renal function slowly improving-supportive care.  Renal ultrasound without hydronephrosis.    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  Urine culture positive for Klebsiella pneumonia-was on IV ceftezole and-transition to Keflex-last day of antibiotics on 4/7.   Hematochezia: No further episodes since admission-completed Anusol x3 days.  Hemoglobin stable.  Eliquis is been continued cautiously.  Plans are to follow-if hematochezia reoccurs-we will then consult GI formally.  Mild decompensated diastolic heart failure: Volume status is stable-follow for now.  Not on any diuretics at this time.   PAF: Rate controlled with metoprolol-continue Eliquis cautiously for now.  After speaking with patient's primary cardiologist over the phone-have discontinued Plavix.  ChadsVasc score of 4  Minimally elevated troponin: Trend is flat-not consistent with ACS.  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 Einar Gip) on 4/2-continue to hold Plavix given hematochezia.  Continue statin  ??Dementia: Suspect Patient has significant amount of dementia at baseline-suspect that may not be evident at home as he may thrive and family surroundings.  See above-we will need outpatient neurology  evaluation.  Palliative care: Elderly gentleman-who probably has some amount of dementia-may not be very evident that she lives at home-presented with AKI, kidney injury and worsening confusion for the past probably-has significant amount of dementia-palliative care following-full code for now.  DVT Prophylaxis: Full dose anticoagulation with Eliquis  Code Status: Full code   Family Communication: Son over the phone 4/6  Disposition Plan: Remain inpatient  Antimicrobial agents: Anti-infectives (From admission, onward)   Start     Dose/Rate Route Frequency Ordered Stop   05/29/18 1100  ceFAZolin (ANCEF) IVPB 1 g/50 mL premix     1 g 100 mL/hr over 30 Minutes Intravenous Every 12 hours 05/29/18 1046     05/27/18 1600  cefTRIAXone (ROCEPHIN) 1 g in sodium chloride 0.9 % 100 mL IVPB  Status:  Discontinued     1 g 200 mL/hr over 30 Minutes Intravenous Every 24 hours 06/10/2018 1630 05/29/18 1046   06/22/2018 1515  cefTRIAXone (ROCEPHIN) 1 g in sodium chloride 0.9 % 100 mL IVPB     1 g 200 mL/hr over 30 Minutes Intravenous  Once 06/22/2018 1511 06/15/2018 1700      Procedures: None  CONSULTS:  None  Time spent: 25- minutes-Greater than 50% of this time was spent in counseling, explanation of diagnosis, planning of further management, and coordination of care.  MEDICATIONS: Scheduled Meds:  apixaban  2.5 mg Oral BID   atorvastatin  40 mg Oral Daily   dorzolamide  1 drop Both Eyes BID   ferrous sulfate  325 mg Oral Q breakfast   latanoprost  1 drop Both Eyes QHS   metoprolol tartrate  50 mg Oral BID   pantoprazole  40 mg Oral Daily   tamsulosin  0.4 mg Oral Daily   Continuous Infusions:   ceFAZolin (ANCEF) IV 1 g (05/30/18 2208)   lactated ringers 75 mL/hr at 05/31/18 0710   PRN Meds:.acetaminophen **OR** acetaminophen, nitroGLYCERIN, ondansetron **OR** ondansetron (ZOFRAN) IV, polyethylene glycol   PHYSICAL EXAM: Vital signs: Vitals:   05/30/18 1505 05/30/18  2209 05/30/18 2347 05/31/18 0549  BP: 123/66 108/81  (!) 117/55  Pulse: (!) 59 66 70 (!) 43  Resp:    20  Temp: (!) 97.3 F (36.3 C) 98.5 F (36.9 C)  97.6 F (36.4 C)  TempSrc: Oral Oral    SpO2: 94% 100% 98% 96%  Weight:      Height:       Filed Weights   06/03/2018 1848  Weight: 99.7 kg   Body mass index is 29.8 kg/m.   General appearance:Awake, much more alert compared to yesterday-markedly better.  Not in any distress. Eyes:no scleral icterus. HEENT: Atraumatic and Normocephalic Neck: supple, no JVD. Resp:Good air entry bilaterally,no rales or rhonchi CVS: S1 S2 regular GI: Bowel sounds present, Non tender and not distended with no gaurding, rigidity or rebound. Extremities: B/L Lower Ext shows no edema, both legs are warm to touch Neurology:  Non focal Musculoskeletal:No digital cyanosis Skin:No Rash, warm and dry Wounds:N/A  I have personally reviewed following labs and imaging studies  LABORATORY DATA: CBC: Recent Labs  Lab 05/28/2018 1215 05/27/18 0037 05/28/18 0937 05/29/18 0255 05/31/18 0420  WBC 8.8 8.6 8.4 8.1 10.4  NEUTROABS 7.0  --   --   --   --   HGB 12.3* 11.1* 11.5* 11.3* 11.5*  HCT 40.3 35.5* 37.6* 36.0* 36.8*  MCV 96.2 90.6 92.8 91.8 91.5  PLT 159 160 168 140* 89*    Basic Metabolic Panel: Recent Labs  Lab 05/27/18 0037 05/28/18 0937 05/29/18 0255 05/30/18 0548 05/31/18 0420  NA 139 144 146* 143 145  K 4.1 4.5 4.8 4.3 4.2  CL 115* 112* 115* 114* 114*  CO2 15* 17* 17* 18* 19*  GLUCOSE 113* 107* 112* 128* 117*  BUN 62* 57* 65* 80* 81*  CREATININE 2.11* 2.19* 2.30* 3.03* 2.84*  CALCIUM 8.7* 9.1 8.9 8.7* 8.8*  MG  --  2.5*  --   --   --     GFR: Estimated Creatinine Clearance: 23.7 mL/min (A) (by C-G formula based on SCr of 2.84 mg/dL (H)).  Liver Function Tests: Recent Labs  Lab 06/09/2018 1215 05/31/18 0420  AST 69* 63*  ALT 43 36  ALKPHOS 99 92  BILITOT 1.9* 1.3*  PROT 6.8 6.2*  ALBUMIN 3.3* 2.9*   No results for  input(s): LIPASE, AMYLASE in the last 168 hours. Recent Labs  Lab 05/30/18 1303  AMMONIA 37*    Coagulation Profile: Recent Labs  Lab 05/27/2018 1215  INR 2.8*    Cardiac Enzymes: Recent Labs  Lab 06/20/2018 1215 06/18/2018 1825 05/27/18 0037  TROPONINI 0.06* 0.05* 0.05*    BNP (last 3 results) No results for input(s): PROBNP in the last 8760 hours.  HbA1C: No results for input(s): HGBA1C in the last 72 hours.  CBG: No results for input(s): GLUCAP in the last 168 hours.  Lipid Profile: No results for input(s): CHOL, HDL, LDLCALC, TRIG, CHOLHDL, LDLDIRECT in the last 72 hours.  Thyroid Function Tests: Recent Labs    05/30/18 1303  TSH 2.111    Anemia Panel: Recent Labs    05/30/18 1303  VITAMINB12 1,809*    Urine analysis:    Component Value Date/Time   COLORURINE YELLOW 06/24/2018 1432   APPEARANCEUR CLOUDY (A) 06/21/2018 1432   LABSPEC 1.015 06/02/2018 1432   PHURINE 6.0 05/29/2018 1432   GLUCOSEU NEGATIVE 06/24/2018 1432   HGBUR SMALL (A) 06/06/2018 1432   BILIRUBINUR NEGATIVE 06/21/2018 1432   KETONESUR 15 (A) 06/17/2018 1432   PROTEINUR 30 (A) 06/15/2018 1432   UROBILINOGEN 0.2 01/12/2012 1318   NITRITE NEGATIVE 06/02/2018 1432   LEUKOCYTESUR MODERATE (A) 06/20/2018 1432    Sepsis Labs: Lactic Acid, Venous    Component Value Date/Time   LATICACIDVEN 2.46 (HH) 01/05/2017 1953    MICROBIOLOGY: Recent Results (from the past 240 hour(s))  Urine culture     Status: Abnormal   Collection Time: 06/21/2018  2:32 PM  Result Value Ref Range Status   Specimen Description URINE, CLEAN CATCH  Final   Special Requests   Final    NONE Performed at [facility], [place] [address], [place], [place] [address]    Culture >=100,000 COLONIES/mL KLEBSIELLA PNEUMONIAE (A)  Final   Report Status 05/28/2018 FINAL  Final   Organism ID, Bacteria KLEBSIELLA PNEUMONIAE (A)  Final      Susceptibility   Klebsiella pneumoniae - MIC*    AMPICILLIN >=32 RESISTANT  Resistant     CEFAZOLIN <=4 SENSITIVE Sensitive     CEFTRIAXONE <=1 SENSITIVE Sensitive     CIPROFLOXACIN <=0.25 SENSITIVE Sensitive     GENTAMICIN <=1 SENSITIVE Sensitive     IMIPENEM 0.5 SENSITIVE Sensitive     NITROFURANTOIN 128 RESISTANT Resistant     TRIMETH/SULFA <=20 SENSITIVE Sensitive     AMPICILLIN/SULBACTAM 8 SENSITIVE Sensitive     PIP/TAZO <=4 SENSITIVE Sensitive     Extended ESBL NEGATIVE Sensitive     * >=100,000 COLONIES/mL KLEBSIELLA PNEUMONIAE  RADIOLOGY STUDIES/RESULTS: Dg Chest 2 View  Result Date: 06/16/2018 CLINICAL DATA:  Loss of consciousness, no chest symptoms EXAM: CHEST - 2 VIEW COMPARISON:  08/12/2017 FINDINGS: Bilateral diffuse interstitial thickening. Small bilateral pleural effusions. No pneumothorax. Stable cardiomegaly. Prior CABG. Dual lead cardiac pacemaker. No acute osseous abnormality. IMPRESSION: Findings consistent with mild CHF. Electronically Signed   By: Kathreen Devoid   On: 05/30/2018 14:39   Ct Head Wo Contrast  Result Date: 05/30/2018 CLINICAL DATA:  Altered level of consciousness, unexplained EXAM: CT HEAD WITHOUT CONTRAST TECHNIQUE: Contiguous axial images were obtained from the base of the skull through the vertex without intravenous contrast. COMPARISON:  06/02/2018 FINDINGS: Brain: No evidence of acute infarction, hemorrhage, hydrocephalus, extra-axial collection or mass lesion/mass effect. Cerebral volume loss that is mild for age and stable. Minor chronic small vessel ischemic change. Vascular: Atherosclerotic calcification Skull: No acute or aggressive finding. Sinuses/Orbits: Left cataract resection IMPRESSION: Aging brain without acute or interval finding. Electronically Signed   By: Monte Fantasia M.D.   On: 05/30/2018 12:03   Ct Head Wo Contrast  Result Date: 06/09/2018 CLINICAL DATA:  Altered level of consciousness. EXAM: CT HEAD WITHOUT CONTRAST TECHNIQUE: Contiguous axial images were obtained from the base of the skull through the  vertex without intravenous contrast. COMPARISON:  CT scan of August 14, 2017. FINDINGS: Brain: Mild diffuse cortical atrophy is noted. Mild chronic ischemic white matter disease is noted. No mass effect or midline shift is noted. Ventricular size is within normal limits. There is no evidence of mass lesion, hemorrhage or acute infarction. Vascular: No hyperdense vessel or unexpected calcification. Skull: Normal. Negative for fracture or focal lesion. Sinuses/Orbits: No acute finding. Other: None. IMPRESSION: Mild diffuse cortical atrophy. Mild chronic ischemic white matter disease. No acute intracranial abnormality seen. Electronically Signed   By: Marijo Conception, M.D.   On: 06/05/2018 14:18   US Renal  Result Date: 05/30/2018 CLINICAL DATA:  Acute kidney injury.  Chronic kidney disease. EXAM: RENAL / URINARY TRACT ULTRASOUND COMPLETE COMPARISON:  AP CT on 06/12/2018 FINDINGS: Right Kidney: Renal measurements: 7.7 x 4.2 x 4.9 cm = volume: 84.3 mL . Echogenicity within normal limits. Diffuse renal parenchymal atrophy. Tiny sub-cm subcapsular cyst noted. No mass or hydronephrosis visualized. Left Kidney: Renal measurements: 10.7 x 6.5 x 6.9 cm = volume: 250.5 mL. Echogenicity within normal limits. No mass or hydronephrosis visualized. Bladder: Appears normal for degree of bladder distention. Other: 2 penile prosthesis reservoirs are seen in the pelvis, as demonstrated on recent CT. Small amount of free fluid noted in left upper quadrant, similar to that seen on previous CT. IMPRESSION: No evidence of hydronephrosis. Diffuse right renal parenchymal atrophy. Small amount of left upper quadrant ascites, as seen on recent CT. Electronically Signed   By: Earle Gell M.D.   On: 05/30/2018 12:11   Dg Chest Port 1v Same Day  Result Date: 05/28/2018 CLINICAL DATA:  Dyspnea EXAM: PORTABLE CHEST 1 VIEW COMPARISON:  06/06/2018 chest radiograph. FINDINGS: Stable configuration 2 lead left subclavian pacemaker and intact  sternotomy wires. Stable cardiomediastinal silhouette with moderate cardiomegaly. No pneumothorax. Possible trace bilateral pleural effusions, unchanged. Stable mild pulmonary edema. IMPRESSION: 1. Persistent mild congestive heart failure, not significantly changed. 2. Possible trace bilateral pleural effusions. Electronically Signed   By: Ilona Sorrel M.D.   On: 05/28/2018 08:32   Ct Renal Stone Study  Result Date: 06/18/2018 CLINICAL DATA:  Left lower quadrant pain. EXAM: CT ABDOMEN AND PELVIS WITHOUT CONTRAST TECHNIQUE: Multidetector CT imaging  of the abdomen and pelvis was performed following the standard protocol without IV contrast. COMPARISON:  CT, 01/05/2017 FINDINGS: Lower chest: Mild cardiomegaly. Small right pleural effusion. Vascular prominence and mild interstitial thickening at the lung bases. Mild dependent subsegmental atelectasis, right lower lobe. Hepatobiliary: Liver normal in size. No mass or focal lesion. Dependent gallstones and a mostly decompressed gallbladder. No bile duct dilation. Pancreas: Unremarkable. No pancreatic ductal dilatation or surrounding inflammatory changes. Spleen: Normal in size without focal abnormality. Adrenals/Urinary Tract: No adrenal masses. Moderate to severe right renal atrophy. Left kidney normal in size. Both kidneys normal in position. Subcentimeter hyperattenuating mass projects posteriorly from the right kidney pole, consistent with a mildly complicated cyst, stable from the prior CT. No other renal masses, no stones and no hydronephrosis. Normal ureters. Bladder is mildly distended. No bladder wall thickening. There is air that tracks along the lower anterior bladder wall, which may reside within the wall. Stomach/Bowel: Stomach is unremarkable. Small bowel is normal in caliber. No wall thickening or evidence of inflammation. Colon is normal caliber. There are numerous diverticula most evident along the sigmoid colon. No evidence of diverticulitis. No  colonic wall thickening or other inflammatory process. Normal appendix visualized. Vascular/Lymphatic: Mild aortic atherosclerosis. No aneurysm. No enlarged lymph nodes. Reproductive: Stable changes from a prior prostatectomy and penile implant insertion. Other: Small amount of ascites collects adjacent to the liver and spleen, tracks along the pericolic gutters and extends into the pelvic recesses. No free air. Musculoskeletal: No fracture or acute finding. Degenerative changes throughout the visualized spine and arthropathic changes of both hips. No osteoblastic or osteolytic lesions. IMPRESSION: 1. Small right pleural effusion, lung base vascular prominence and interstitial thickening and mild cardiomegaly. Findings support mild congestive heart failure if there are symptoms of shortness of breath. 2. Small amount of ascites unclear etiology. 3. Small amount of air tracks along the anterior inferior aspect of the bladder, appearing to project within the bladder wall. There is no bladder wall thickening. No bladder masses. The etiology of this finding is unclear. Correlate with any history of recent bladder instrumentation. Emphysematous cystitis should be considered in the proper clinical setting, despite the lack of bladder wall thickening. 4. No other evidence of an acute abnormality. 5. Numerous colonic diverticula mostly along the sigmoid colon. No evidence of diverticulitis. 6. Moderate to marked right renal atrophy. 7. Aortic atherosclerosis. Electronically Signed   By: Lajean Manes M.D.   On: 06/11/2018 14:26     LOS: 4 days   Oren Binet, MD  [facility]  If 7PM-7AM, please contact night-coverage  Please page via [email or web address]  Go to [email or web address] and use [facility] universal password to access. If you do not have the password, please contact the hospital operator.  Locate the [facility] provider you are looking for under [facility] and page to a number that you can be directly  reached. If you still have difficulty reaching the provider, please page the [facility] (Director on Call) for the Hospitalists listed on amion for assistance.  05/31/2018, 8:54 AM

## 2018-05-31 NOTE — Progress Notes (Signed)
Physical Therapy Treatment Patient Details Name: James Harvey MRN: [id number] DOB: [birth year] Today's Date: 05/31/2018    History of Present Illness 85 y.o. male with history of atrial fibrillation on anticoagulation with Eliquis, CAD s/p PCI to mid LAD in June 2019, s/p permanent pacemaker implantation presented to the ED with worsening confusion, intermittent rectal bleeding-found to have acute kidney injury. Dx acute metabolic encephalopathy likely due to AKI, on top of underlying dementia, and UTI    PT Comments    Continuing work on functional mobility and activity tolerance;  Much better able to participate, and walked the hallways today; second person helpful for safety; Will continue to work on mobility, and follow Palliative's lead for [place]; if he has 24 hour assistance at home, I favor home to familiar environment, routine, and caregivers  Follow Up Recommendations  Supervision/Assistance - 24 hour;Other (comment)(If 24 hour reliable assistance at home, then home with HHPT )     Equipment Recommendations  Rolling walker with 5" wheels    Recommendations for Other Services       Precautions / Restrictions Precautions Precautions: Fall Precaution Comments: sitter in room    Mobility  Bed Mobility Overal bed mobility: Needs Assistance Bed Mobility: Supine to Sit     Supine to sit: Mod assist     General bed mobility comments: Good use of bedrails to semi-roll to the side to initiate; Light mod assist to pull to sit  Transfers Overall transfer level: Needs assistance Equipment used: Rolling walker (2 wheeled) Transfers: Sit to/from Stand Sit to Stand: Min guard         General transfer comment: min guard for safety with power up and steadying  Ambulation/Gait Ambulation/Gait assistance: Min assist;+2 safety/equipment(chair push) Gait Distance (Feet): 65 Feet Assistive device: Rolling walker (2 wheeled) Gait Pattern/deviations: Shuffle;Decreased step length -  right;Decreased step length - left;Trunk flexed Gait velocity: slowed   General Gait Details: Cues to self-monitor for activity tolerance; heavy dependence on RW for balance   Stairs             Wheelchair Mobility    Modified Rankin (Stroke Patients Only)       Balance     Sitting balance-Leahy Scale: Fair       Standing balance-Leahy Scale: Poor                              Cognition Arousal/Alertness: Awake/alert Behavior During Therapy: WFL for tasks assessed/performed Overall Cognitive Status: No family/caregiver present to determine baseline cognitive functioning                                        Exercises      General Comments        Pertinent Vitals/Pain Pain Assessment: No/denies pain    Home Living                      Prior Function            PT Goals (current goals can now be found in the care plan section) Acute Rehab PT Goals Patient Stated Goal: agreeable to walk PT Goal Formulation: With patient Time For Goal Achievement: 06/10/18 Potential to Achieve Goals: Fair Progress towards PT goals: Progressing toward goals    Frequency    Min 3X/week      PT  Plan Discharge plan needs to be updated    Co-evaluation              AM-PAC PT "6 Clicks" Mobility   Outcome Measure  Help needed turning from your back to your side while in a flat bed without using bedrails?: A Little Help needed moving from lying on your back to sitting on the side of a flat bed without using bedrails?: A Little Help needed moving to and from a bed to a chair (including a wheelchair)?: A Little Help needed standing up from a chair using your arms (e.g., wheelchair or bedside chair)?: A Little Help needed to walk in hospital room?: A Little Help needed climbing 3-5 steps with a railing? : A Lot 6 Click Score: 17    End of Session Equipment Utilized During Treatment: Gait belt Activity Tolerance:  Patient tolerated treatment well Patient left: in chair;with call bell/phone within reach;with chair alarm set;with nursing/sitter in room Nurse Communication: Mobility status PT Visit Diagnosis: Unsteadiness on feet (R26.81);Other abnormalities of gait and mobility (R26.89);Muscle weakness (generalized) (M62.81);Difficulty in walking, not elsewhere classified (R26.2)     Time: 2637-8588 PT Time Calculation (min) (ACUTE ONLY): 28 min  Charges:  $Gait Training: 8-22 mins $Therapeutic Activity: 8-22 mins                     Roney Marion, PT  Acute Rehabilitation Services Pager [phone] Office [place] 05/31/2018, 5:12 PM

## 2018-05-31 NOTE — Care Management Important Message (Signed)
Important Message  Patient Details  Name: James Harvey MRN: [id number] Date of Birth: 1932/07/29   Medicare Important Message Given:  Yes  Due to illness patient is not able to sign.  Unsigned copy left   Minie Roadcap 05/31/2018, 4:18 PM

## 2018-06-01 DIAGNOSIS — Z515 Encounter for palliative care: Secondary | ICD-10-CM

## 2018-06-01 DIAGNOSIS — F0391 Unspecified dementia with behavioral disturbance: Secondary | ICD-10-CM

## 2018-06-01 DIAGNOSIS — Z7189 Other specified counseling: Secondary | ICD-10-CM

## 2018-06-01 LAB — BASIC METABOLIC PANEL
Anion gap: 11 (ref 5–15)
BUN: 72 mg/dL — ABNORMAL HIGH (ref 8–23)
CO2: 21 mmol/L — ABNORMAL LOW (ref 22–32)
Calcium: 8.9 mg/dL (ref 8.9–10.3)
Chloride: 114 mmol/L — ABNORMAL HIGH (ref 98–111)
Creatinine, Ser: 2.35 mg/dL — ABNORMAL HIGH (ref 0.61–1.24)
GFR calc Af Amer: 28 mL/min — ABNORMAL LOW (ref >60)
GFR calc non Af Amer: 24 mL/min — ABNORMAL LOW (ref >60)
Glucose, Bld: 103 mg/dL — ABNORMAL HIGH (ref 70–99)
Potassium: 4 mmol/L (ref 3.5–5.1)
Sodium: 146 mmol/L — ABNORMAL HIGH (ref 135–145)

## 2018-06-01 MED ORDER — CEFAZOLIN SODIUM-DEXTROSE 1-4 GM/50ML-% IV SOLN
1.0000 g | Freq: Two times a day (BID) | INTRAVENOUS | Status: AC
  Filled 2018-06-01: qty 50

## 2018-06-01 MED ORDER — HALOPERIDOL LACTATE 5 MG/ML IJ SOLN
2.0000 mg | Freq: Once | INTRAMUSCULAR | Status: AC
  Administered 2018-06-01: 2 mg via INTRAMUSCULAR
  Filled 2018-06-01: qty 1

## 2018-06-01 NOTE — Progress Notes (Signed)
Daily Progress Note   Patient Name: James Harvey       Date: 06/01/2018 DOB: 26-Aug-1932  Age: 85 y.o. MRN#: [id number] Attending Physician: Jonetta Osgood, MD Primary Care Physician: Tamsen Roers, MD Admit Date: 06/08/2018  Reason for Consultation/Follow-up: Establishing goals of care  Subjective: Patient awake, alert but pleasantly confused. Oriented to self. Reoriented to place, time, situation. Safety sitter at bedside.   GOC:  F/u with family regarding [place] including son [name]) and DIL [name]).   Introduced Palliative Medicine as specialized medical care for people living with serious illness. It focuses on providing relief from the symptoms and stress of a serious illness. The goal is to improve quality of life for both the patient and the family.  We discussed a brief life review of the patient. Family tells me he has worked all his life. Owns two businesses. He enjoys fishing.   Prior to about two weeks ago, patient living alone and independent. [name] and [name] check on him daily. He has become increasingly confused in the last two weeks and requiring assist with ADL's. Never diagnosed with dementia. Per son, occasionally "forgetful."   Discussed events leading up to admission and course of hospital diagnoses and interventions. Family speaks of their appreciation with Dr. Sloan Leiter for daily updates. They do share that he becomes more confused/delirious in the hospital.   I attempted to elicit values and goals of care important to the patient and family. Advanced directives, concepts specific to code status, artifical feeding and hydration were discussed. Family believes he has completed a financial will with an attorney but do not think he has documented HCPOA or wishes regarding  life prolonging interventions. [name] and [name] plan to reach out to "[name]'s" attorney to gather more information on his documents.   They do share that in regards to medical interventions, he "doesn't want to be a burden" and has spoke of his wish to "die at home." Encouraged they review documents from his attorney and further discuss goals of care/limitations to care, especially if worsening dementia. Introduced MOST form as a good document to complete with MD, PA, or NP. Hard Choices booklet and MOST form left in patient belonging bag. Also encouraged early documentation of wishes in order to possibly honor his wish to die at home when the time comes. Family  understands and appreciates information left.   [name] and [name] also speak of the importance of keeping him out of a nursing facility. They are able to care for him 24/7 and will move in permanently with him if necessary. They agree with home health PT when discharged and hopeful his cognitive status will improve once stable to discharge home.   Questions and concerns were addressed. PMT contact information given.    Length of Stay: 5  Current Medications: Scheduled Meds:  . apixaban  2.5 mg Oral BID  . atorvastatin  40 mg Oral Daily  . dorzolamide  1 drop Both Eyes BID  . ferrous sulfate  325 mg Oral Q breakfast  . latanoprost  1 drop Both Eyes QHS  . metoprolol tartrate  50 mg Oral BID  . pantoprazole  40 mg Oral Daily  . tamsulosin  0.4 mg Oral Daily    Continuous Infusions: .  ceFAZolin (ANCEF) IV    . lactated ringers 50 mL/hr at 06/01/18 1220    PRN Meds: acetaminophen **OR** acetaminophen, nitroGLYCERIN, ondansetron **OR** ondansetron (ZOFRAN) IV, polyethylene glycol  Physical Exam Vitals signs and nursing note reviewed.  Constitutional:      General: He is awake.  HENT:     Head: Normocephalic and atraumatic.  Pulmonary:     Effort: No tachypnea, accessory muscle usage or respiratory distress.  Skin:    General:  Skin is warm and dry.  Neurological:     Mental Status: He is alert.     Comments: Oriented to person. Pleasantly confused/forgetful.            Vital Signs: BP (!) 111/97 (BP Location: Right Arm)   Pulse 63   Temp (!) 97.4 F (36.3 C) (Oral)   Resp 20   Ht 6' (1.829 m)   Wt 99.2 kg   SpO2 90%   BMI 29.66 kg/m  SpO2: SpO2: 90 % O2 Device: O2 Device: Room Air O2 Flow Rate: O2 Flow Rate (L/min): 2 L/min  Intake/output summary:   Intake/Output Summary (Last 24 hours) at 06/01/2018 1420 Last data filed at 06/01/2018 1336 Gross per 24 hour  Intake 3075.04 ml  Output 917 ml  Net 2158.04 ml   LBM: Last BM Date: 06/01/18 Baseline Weight: Weight: 99.7 kg Most recent weight: Weight: 99.2 kg       Palliative Assessment/Data: PPS 50%   Flowsheet Rows     Most Recent Value  Intake Tab  Referral Department  Hospitalist  Unit at Time of Referral  Med/Surg Unit  Palliative Care Primary Diagnosis  Sepsis/Infectious Disease  Date Notified  05/30/18  Palliative Care Type  New Palliative care  Reason for referral  Clarify Goals of Care, Psychosocial or Spiritual support  Date of Admission  06/07/2018  Date first seen by Palliative Care  05/30/18  # of days Palliative referral response time  0 Day(s)  # of days IP prior to Palliative referral  4  Clinical Assessment  Palliative Performance Scale Score  50%  Pain Max last 24 hours  Not able to report  Pain Min Last 24 hours  Not able to report  Dyspnea Max Last 24 Hours  Not able to report  Dyspnea Min Last 24 hours  Not able to report  Nausea Max Last 24 Hours  Not able to report  Nausea Min Last 24 Hours  Not able to report  Anxiety Max Last 24 Hours  Not able to report  Anxiety Min Last 24 Hours  Not able to report  Other Max Last 24 Hours  Not able to report  Psychosocial & Spiritual Assessment  Palliative Care Outcomes  Patient/Family meeting held?  Yes  Who was at the meeting?  pt, DIL  Palliative Care Outcomes  Provided  psychosocial or spiritual support  Palliative Care follow-up planned  Yes, Facility      Patient Active Problem List   Diagnosis Date Noted  . Palliative care encounter   . AKI (acute kidney injury) ([place]) 06/22/2018  . Acute cystitis 06/07/2018  . Dementia ([place]) 06/17/2018  . Encephalopathy 06/01/2018  . External hemorrhoid 06/21/2018  . Elevated troponin 06/01/2018  . CKD (chronic kidney disease) stage 3, GFR 30-59 ml/min ([facility]) 04/20/2018  . Hyperglycemia 04/20/2018  . Hypercholesterolemia 04/20/2018  . Paroxysmal atrial fibrillation ([place]) 08/14/2017  . S/P placement of cardiac pacemaker 08/14/2017  . Thrombocytopenia ([place]) 08/14/2017  . Delirium 08/14/2017  . Closed C2 fracture ([place]) 01/06/2017  . Bilateral hand numbness 07/15/2016  . Other spondylosis with radiculopathy, cervical region 07/15/2016  . CAD (coronary artery disease) 05/24/2010  . Atrioventricular block, complete ([place]) 05/24/2010  . Hypertension 05/24/2010    Palliative Care Assessment & Plan   Patient Profile: 85 y.o. male  with past medical history of coronary artery disease with history of CABG 2011, pacemaker, atrial fib on anticoagulation, congestive heart failure, dementia, hypertension, anxiety, chronic kidney disease stage III, , hyperlipidemia, history of prostate cancer admitted on 06/17/2018 with altered mental status.  Patient has been very confused in the hospital setting with a history of hospital delirium. Consult ordered for goals of care.   Assessment: Acute metabolic encephalopathy AKI Complicate UTI Hematochezia Mild decompensated diastolic CHF PAF  ? Dementia Hx CAD s/p CABG/PCC  Recommendations/Plan:  Continue FULL code and current plan of care.   Good initial [place] discussion with patient's son and daughter-in-law. They plan to further review his living will documentation with attorney. They do not believe he has documented HCPOA or information regarding life-prolonging interventions,  just financial will. Encouraged early discussions and limitations to care, especially if progression of dementia. Hard Choices booklet and MOST form left in patient belonging bag for family to review and consider completing with PCP.   Family speaks of importance of keeping patient out of a nursing facility. They will provided 24/7 care. Plan for home with home health services.   May benefit from outpatient palliative services for ongoing GOC/advance directive discussions.    Code Status: FULL   Code Status Orders  (From admission, onward)         Start     Ordered   06/01/2018 1621  Full code  Continuous     06/24/2018 1621        Code Status History    Date Active Date Inactive Code Status Order ID Comments User Context   08/12/2017 1859 08/14/2017 1746 Full Code [id number]  Adrian Prows, MD ED   01/06/2017 0054 01/06/2017 1745 Full Code [id number]  Clovis Riley, MD ED       Prognosis:   Unable to determine  Discharge Planning:  Home with [place] was discussed with patient, son, DIL  Thank you for allowing the Palliative Medicine Team to assist in the care of this patient.   Time In: 1200 Time Out: 1240 Total Time 40 Prolonged Time Billed  no      Greater than 50%  of this time was spent counseling and coordinating care related to the above assessment  and plan.  Ihor Dow, FNP-C Palliative Medicine Team  Phone: [phone] Fax: [phone]  Please contact Palliative Medicine Team phone at [phone] for questions and concerns.

## 2018-06-01 NOTE — Progress Notes (Signed)
Physical Therapy Treatment Patient Details Name: James Harvey MRN: [id number] DOB: 1932-04-23 Today's Date: 06/01/2018    History of Present Illness 85 y.o. male with history of atrial fibrillation on anticoagulation with Eliquis, CAD s/p PCI to mid LAD in June 2019, s/p permanent pacemaker implantation presented to the ED with worsening confusion, intermittent rectal bleeding-found to have acute kidney injury. Dx acute metabolic encephalopathy likely due to AKI, on top of underlying dementia, and UTI    PT Comments    Pt very restless this session and very tangential. Fixated on wires hanging from the TV this session and needing a toothpick. Performed bed mobility with mod +2. Once sitting at EOB, pt abruptly returning to supine and reports he was not walking. Despite max encouragement pt continuing to refuse. Feel pt may be more comfortable and cooperative in familiar environment, however, will require 24/7 assist. If 24/7 assist unavailable, pt will likely require SNF level therapy. Will continue to follow acutely to maximize functional mobility independence and safety.    Follow Up Recommendations  Supervision/Assistance - 24 hour;Other (comment)(If 24 hour reliable assistance at home, then home with HHPT )     Equipment Recommendations  Rolling walker with 5" wheels    Recommendations for Other Services       Precautions / Restrictions Precautions Precautions: Fall Precaution Comments: sitter in room Restrictions Weight Bearing Restrictions: No    Mobility  Bed Mobility Overal bed mobility: Needs Assistance Bed Mobility: Supine to Sit     Supine to sit: Mod assist;+2 for physical assistance Sit to supine: Supervision   General bed mobility comments: Instructed pt to sit at EOB, pt reaching out for assist and coming to long sitting in bed. Multimodal cues provided next to have pt sit at EOB. Required mod A +2 for LE assist and trunk elevation. Put gait belt on pt and was  getting walker, when pt abruptly returned to supine and reports he isn't walking. Despite max encouragement, pt continued to refuse. Mod A +2 to come to long sitting at end of session to remove gait belt.   Transfers                 General transfer comment: Pt refusing   Ambulation/Gait                 Stairs             Wheelchair Mobility    Modified Rankin (Stroke Patients Only)       Balance Overall balance assessment: Needs assistance Sitting-balance support: No upper extremity supported;Feet supported Sitting balance-Leahy Scale: Fair                                      Cognition Arousal/Alertness: Awake/alert Behavior During Therapy: Restless Overall Cognitive Status: No family/caregiver present to determine baseline cognitive functioning                                 General Comments: Pt very fixated on wires connected to the TV, although, did not note any wires. Pt reports he had talked to PT's mother, brother, and sister. VERY tangential this session.      Exercises General Exercises - Lower Extremity Heel Slides: AAROM;Both;5 reps;Supine    General Comments        Pertinent Vitals/Pain Pain Assessment: No/denies pain  Home Living                      Prior Function            PT Goals (current goals can now be found in the care plan section) Acute Rehab PT Goals Patient Stated Goal: "get those wires on the TV taken care of"  PT Goal Formulation: With patient Time For Goal Achievement: 06/10/18 Potential to Achieve Goals: Fair Progress towards PT goals: Progressing toward goals    Frequency    Min 3X/week      PT Plan Current plan remains appropriate    Co-evaluation              AM-PAC PT "6 Clicks" Mobility   Outcome Measure  Help needed turning from your back to your side while in a flat bed without using bedrails?: A Lot Help needed moving from lying on your  back to sitting on the side of a flat bed without using bedrails?: A Lot Help needed moving to and from a bed to a chair (including a wheelchair)?: A Little Help needed standing up from a chair using your arms (e.g., wheelchair or bedside chair)?: A Little Help needed to walk in hospital room?: A Little Help needed climbing 3-5 steps with a railing? : A Lot 6 Click Score: 15    End of Session Equipment Utilized During Treatment: Gait belt Activity Tolerance: Patient tolerated treatment well Patient left: in bed;with call bell/phone within reach;with family/visitor present Nurse Communication: Mobility status PT Visit Diagnosis: Unsteadiness on feet (R26.81);Other abnormalities of gait and mobility (R26.89);Muscle weakness (generalized) (M62.81);Difficulty in walking, not elsewhere classified (R26.2)     Time: 9381-8299 PT Time Calculation (min) (ACUTE ONLY): 14 min  Charges:  $Therapeutic Activity: 8-22 mins                     Leighton Ruff, PT, DPT  Acute Rehabilitation Services  Pager: [phone] Office: [phone]    Rudean Hitt 06/01/2018, 1:53 PM

## 2018-06-01 NOTE — Progress Notes (Addendum)
PROGRESS NOTE        PATIENT DETAILS Name: James Harvey Age: 85 y.o. Sex: male Date of Birth: Mar 12, 1932 Admit Date: 06/08/2018 Admitting Physician Shelda Pal, DO IDP:[id number], Jeneen Rinks, MD  Brief Narrative: Patient is a 85 y.o. male with history of atrial fibrillation on anticoagulation with Eliquis, CAD s/p PCI to mid LAD in June 2019, s/p permanent pacemaker implantation,?  Dementia/cognitive dysfunction at baseline presented to the ED with worsening confusion, intermittent rectal bleeding-found to have acute kidney injury and admitted to the hospitalist service.  See below for further details.  Subjective: Awake-somewhat confused-but not lethargic-slowly improving.  Assessment/Plan: Acute metabolic encephalopathy: Felt to be multifactorial etiology-secondary to worsening renal function, complicated UTI-with some suspicion that this is in the background of cognitive dysfunction/dementia.  CT head without any abnormalities.  Hospital course complicated by delirium-which was likely secondary to sundowning-but also worsened by the use of antipsychotics, sedatives.  All antipsychotic/sedatives were discontinued-bedside sitter was placed-he has now slowly improved with this measure-although slightly confused she is no longer lethargic.  CT head x2- for acute abnormalities-unable to perform MRI due to PPM.  EEG without seizures.  Vitamin B12 within normal limits, RPR negative.  Suspect has some amount of dementia at baseline-may not be evident as he lives at home and in familiar surroundings.  Will probably require outpatient neurology evaluation at some point..  Did speak with patient's son over the past few days-apparently patient does get delirium whenever he is in the hospital-son does not know of any formal diagnosis of dementia-apparently the family started living with him only 2 weeks ago as he started getting confused.  Per son, patient takes Xanax  intermittently-he is not sure if patient takes it on a daily basis.  AKI: The factorial-likely secondary to UTI/dehydration and intermittent urinary retention.  Has required in and out catheterizations intermittently.  Per nursing staff-did not require catheterization last night.  Continue to treat UTI with antimicrobial therapy-continue to hold losartan-renal function slowly improving-supportive care.  Renal ultrasound without hydronephrosis.    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  Urine culture positive for Klebsiella pneumonia-continue IV cefazolin-last day of antibiotics on 4/7.    Hematochezia: No further episodes since admission-completed Anusol x3 days.  Hemoglobin stable.  Eliquis is been continued cautiously.  Plans are to follow-if hematochezia reoccurs-we will then consult GI formally.  Mild decompensated diastolic heart failure: Volume status is stable-follow for now.  Not on any diuretics at this time.   PAF: Rate controlled with metoprolol-continue Eliquis cautiously for now.  After speaking with patient's primary cardiologist over the phone-have discontinued Plavix.  ChadsVasc score of 4  Minimally elevated troponin: Trend is flat-not consistent with ACS.  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 Einar Gip) on 4/2-continue to hold Plavix given hematochezia.  Continue statin  ??Dementia: Suspect Patient has significant amount of dementia at baseline-suspect that may not be evident at home as he may thrive and family surroundings.  See above-we will need outpatient neurology evaluation.    Palliative care: Elderly gentleman-who probably has  some amount of dementia-may  not be very evident as he lives at home-presented with AKI, kidney injury and worsening confusio-palliative care following-full code for now.  DVT Prophylaxis: Full dose anticoagulation with Eliquis  Code Status: Full code   Family Communication: Son over the phone 4/7  Disposition Plan: Remain inpatient  Antimicrobial agents: Anti-infectives (From admission, onward)   Start     Dose/Rate Route Frequency Ordered Stop   05/29/18 1100  ceFAZolin (ANCEF) IVPB 1 g/50 mL premix     1 g 100 mL/hr over 30 Minutes Intravenous Every 12 hours 05/29/18 1046     05/27/18 1600  cefTRIAXone (ROCEPHIN) 1 g in sodium chloride 0.9 % 100 mL IVPB  Status:  Discontinued     1 g 200 mL/hr over 30 Minutes Intravenous Every 24 hours 06/23/2018 1630 05/29/18 1046   06/03/2018 1515  cefTRIAXone (ROCEPHIN) 1 g in sodium chloride 0.9 % 100 mL IVPB     1 g 200 mL/hr over 30 Minutes Intravenous  Once 06/17/2018 1511 05/27/2018 1700      Procedures: None  CONSULTS:  None  Time spent: 25- minutes-Greater than 50% of this time was spent in counseling, explanation of diagnosis, planning of further management, and coordination of care.  MEDICATIONS: Scheduled Meds:  apixaban  2.5 mg Oral BID   atorvastatin  40 mg Oral Daily   dorzolamide  1 drop Both Eyes BID   ferrous sulfate  325 mg Oral Q breakfast   latanoprost  1 drop Both Eyes QHS   metoprolol tartrate  50 mg Oral BID   pantoprazole  40 mg Oral Daily   tamsulosin  0.4 mg Oral Daily   Continuous Infusions:   ceFAZolin (ANCEF) IV 1 g (06/01/18 0919)   lactated ringers 75 mL/hr at 06/01/18 0919   PRN Meds:.acetaminophen **OR** acetaminophen, nitroGLYCERIN, ondansetron **OR** ondansetron (ZOFRAN) IV, polyethylene glycol   PHYSICAL EXAM: Vital signs: Vitals:   05/31/18 2235 06/01/18 0430 06/01/18 0448 06/01/18 0903  BP: 123/69 (!) 146/87  132/65  Pulse: 81 72  60  Resp: 16 18    Temp: 97.6 F (36.4 C) 97.6  F (36.4 C)    TempSrc: Oral Oral    SpO2: 94% 99%    Weight:   99.2 kg   Height:       Filed Weights   06/22/2018 1848 06/01/18 0448  Weight: 99.7 kg 99.2 kg   Body mass index is 29.66 kg/m.   General appearance:Awake, pleasantly confused at times-able to tell me his son's name, he knows who the president is-confused at times still but can be easily reoriented.   Eyes:no scleral icterus. HEENT: Atraumatic and Normocephalic Neck: supple, no JVD. Resp:Good air entry bilaterally,no rales or rhonchi CVS: S1 S2 regular GI: Bowel sounds present, Non tender and not distended with no gaurding, rigidity or rebound. Extremities: B/L Lower Ext shows no edema, both legs are warm to touch Neurology:  Non focal Musculoskeletal:No digital cyanosis Skin:No Rash, warm and dry Wounds:N/A I have personally reviewed following labs and imaging studies  LABORATORY DATA: CBC: Recent Labs  Lab 06/07/2018 1215 05/27/18 0037 05/28/18 0937 05/29/18 0255 05/31/18 0420  WBC 8.8 8.6 8.4 8.1 10.4  NEUTROABS 7.0  --   --   --   --   HGB 12.3* 11.1* 11.5* 11.3* 11.5*  HCT 40.3 35.5* 37.6* 36.0* 36.8*  MCV 96.2 90.6 92.8 91.8 91.5  PLT 159 160 168 140* 89*    Basic Metabolic Panel: Recent Labs  Lab 05/28/18 0937 05/29/18 0255 05/30/18  8119 05/31/18 0420 06/01/18 0721  NA 144 146* 143 145 146*  K 4.5 4.8 4.3 4.2 4.0  CL 112* 115* 114* 114* 114*  CO2 17* 17* 18* 19* 21*  GLUCOSE 107* 112* 128* 117* 103*  BUN 57* 65* 80* 81* 72*  CREATININE 2.19* 2.30* 3.03* 2.84* 2.35*  CALCIUM 9.1 8.9 8.7* 8.8* 8.9  MG 2.5*  --   --   --   --     GFR: Estimated Creatinine Clearance: 28.5 mL/min (A) (by C-G formula based on SCr of 2.35 mg/dL (H)).  Liver Function Tests: Recent Labs  Lab 06/13/2018 1215 05/31/18 0420  AST 69* 63*  ALT 43 36  ALKPHOS 99 92  BILITOT 1.9* 1.3*  PROT 6.8 6.2*  ALBUMIN 3.3* 2.9*   No results for input(s): LIPASE, AMYLASE in the last 168 hours. Recent Labs  Lab  05/30/18 1303  AMMONIA 37*    Coagulation Profile: Recent Labs  Lab 06/23/2018 1215  INR 2.8*    Cardiac Enzymes: Recent Labs  Lab 06/20/2018 1215 06/03/2018 1825 05/27/18 0037  TROPONINI 0.06* 0.05* 0.05*    BNP (last 3 results) No results for input(s): PROBNP in the last 8760 hours.  HbA1C: No results for input(s): HGBA1C in the last 72 hours.  CBG: No results for input(s): GLUCAP in the last 168 hours.  Lipid Profile: No results for input(s): CHOL, HDL, LDLCALC, TRIG, CHOLHDL, LDLDIRECT in the last 72 hours.  Thyroid Function Tests: Recent Labs    05/30/18 1303  TSH 2.111    Anemia Panel: Recent Labs    05/30/18 1303  VITAMINB12 1,809*    Urine analysis:    Component Value Date/Time   COLORURINE YELLOW 06/15/2018 1432   APPEARANCEUR CLOUDY (A) 06/22/2018 1432   LABSPEC 1.015 06/02/2018 1432   PHURINE 6.0 05/28/2018 1432   GLUCOSEU NEGATIVE 05/27/2018 1432   HGBUR SMALL (A) 05/31/2018 1432   BILIRUBINUR NEGATIVE 06/03/2018 1432   KETONESUR 15 (A) 06/16/2018 1432   PROTEINUR 30 (A) 05/31/2018 1432   UROBILINOGEN 0.2 01/12/2012 1318   NITRITE NEGATIVE 06/13/2018 1432   LEUKOCYTESUR MODERATE (A) 06/03/2018 1432    Sepsis Labs: Lactic Acid, Venous    Component Value Date/Time   LATICACIDVEN 2.46 (HH) 01/05/2017 1953    MICROBIOLOGY: Recent Results (from the past 240 hour(s))  Urine culture     Status: Abnormal   Collection Time: 05/28/2018  2:32 PM  Result Value Ref Range Status   Specimen Description URINE, CLEAN CATCH  Final   Special Requests   Final    NONE Performed at [facility], [place] [address] [place], [place] [address]    Culture >=100,000 COLONIES/mL KLEBSIELLA PNEUMONIAE (A)  Final   Report Status 05/28/2018 FINAL  Final   Organism ID, Bacteria KLEBSIELLA PNEUMONIAE (A)  Final      Susceptibility   Klebsiella pneumoniae - MIC*    AMPICILLIN >=32 RESISTANT Resistant     CEFAZOLIN <=4 SENSITIVE Sensitive     CEFTRIAXONE  <=1 SENSITIVE Sensitive     CIPROFLOXACIN <=0.25 SENSITIVE Sensitive     GENTAMICIN <=1 SENSITIVE Sensitive     IMIPENEM 0.5 SENSITIVE Sensitive     NITROFURANTOIN 128 RESISTANT Resistant     TRIMETH/SULFA <=20 SENSITIVE Sensitive     AMPICILLIN/SULBACTAM 8 SENSITIVE Sensitive     PIP/TAZO <=4 SENSITIVE Sensitive     Extended ESBL NEGATIVE Sensitive     * >=100,000 COLONIES/mL KLEBSIELLA PNEUMONIAE    RADIOLOGY STUDIES/RESULTS: Dg Chest 2 View  Result Date: 05/27/2018 CLINICAL DATA:  Loss of consciousness, no chest symptoms EXAM: CHEST - 2 VIEW COMPARISON:  08/12/2017 FINDINGS: Bilateral diffuse interstitial thickening. Small bilateral pleural effusions. No pneumothorax. Stable cardiomegaly. Prior CABG. Dual lead cardiac pacemaker. No acute osseous abnormality. IMPRESSION: Findings consistent with mild CHF. Electronically Signed   By: Kathreen Devoid   On: 06/15/2018 14:39   Ct Head Wo Contrast  Result Date: 05/30/2018 CLINICAL DATA:  Altered level of consciousness, unexplained EXAM: CT HEAD WITHOUT CONTRAST TECHNIQUE: Contiguous axial images were obtained from the base of the skull through the vertex without intravenous contrast. COMPARISON:  06/22/2018 FINDINGS: Brain: No evidence of acute infarction, hemorrhage, hydrocephalus, extra-axial collection or mass lesion/mass effect. Cerebral volume loss that is mild for age and stable. Minor chronic small vessel ischemic change. Vascular: Atherosclerotic calcification Skull: No acute or aggressive finding. Sinuses/Orbits: Left cataract resection IMPRESSION: Aging brain without acute or interval finding. Electronically Signed   By: Monte Fantasia M.D.   On: 05/30/2018 12:03   Ct Head Wo Contrast  Result Date: 06/01/2018 CLINICAL DATA:  Altered level of consciousness. EXAM: CT HEAD WITHOUT CONTRAST TECHNIQUE: Contiguous axial images were obtained from the base of the skull through the vertex without intravenous contrast. COMPARISON:  CT scan of August 14, 2017. FINDINGS: Brain: Mild diffuse cortical atrophy is noted. Mild chronic ischemic white matter disease is noted. No mass effect or midline shift is noted. Ventricular size is within normal limits. There is no evidence of mass lesion, hemorrhage or acute infarction. Vascular: No hyperdense vessel or unexpected calcification. Skull: Normal. Negative for fracture or focal lesion. Sinuses/Orbits: No acute finding. Other: None. IMPRESSION: Mild diffuse cortical atrophy. Mild chronic ischemic white matter disease. No acute intracranial abnormality seen. Electronically Signed   By: Marijo Conception, M.D.   On: 06/24/2018 14:18   US Renal  Result Date: 05/30/2018 CLINICAL DATA:  Acute kidney injury.  Chronic kidney disease. EXAM: RENAL / URINARY TRACT ULTRASOUND COMPLETE COMPARISON:  AP CT on 06/12/2018 FINDINGS: Right Kidney: Renal measurements: 7.7 x 4.2 x 4.9 cm = volume: 84.3 mL . Echogenicity within normal limits. Diffuse renal parenchymal atrophy. Tiny sub-cm subcapsular cyst noted. No mass or hydronephrosis visualized. Left Kidney: Renal measurements: 10.7 x 6.5 x 6.9 cm = volume: 250.5 mL. Echogenicity within normal limits. No mass or hydronephrosis visualized. Bladder: Appears normal for degree of bladder distention. Other: 2 penile prosthesis reservoirs are seen in the pelvis, as demonstrated on recent CT. Small amount of free fluid noted in left upper quadrant, similar to that seen on previous CT. IMPRESSION: No evidence of hydronephrosis. Diffuse right renal parenchymal atrophy. Small amount of left upper quadrant ascites, as seen on recent CT. Electronically Signed   By: Earle Gell M.D.   On: 05/30/2018 12:11   Dg Chest Port 1v Same Day  Result Date: 05/28/2018 CLINICAL DATA:  Dyspnea EXAM: PORTABLE CHEST 1 VIEW COMPARISON:  06/20/2018 chest radiograph. FINDINGS: Stable configuration 2 lead left subclavian pacemaker and intact sternotomy wires. Stable cardiomediastinal silhouette with moderate  cardiomegaly. No pneumothorax. Possible trace bilateral pleural effusions, unchanged. Stable mild pulmonary edema. IMPRESSION: 1. Persistent mild congestive heart failure, not significantly changed. 2. Possible trace bilateral pleural effusions. Electronically Signed   By: Ilona Sorrel M.D.   On: 05/28/2018 08:32   Ct Renal Stone Study  Result Date: 06/18/2018 CLINICAL DATA:  Left lower quadrant pain. EXAM: CT ABDOMEN AND PELVIS WITHOUT CONTRAST TECHNIQUE: Multidetector CT imaging of the abdomen and pelvis was performed  following the standard protocol without IV contrast. COMPARISON:  CT, 01/05/2017 FINDINGS: Lower chest: Mild cardiomegaly. Small right pleural effusion. Vascular prominence and mild interstitial thickening at the lung bases. Mild dependent subsegmental atelectasis, right lower lobe. Hepatobiliary: Liver normal in size. No mass or focal lesion. Dependent gallstones and a mostly decompressed gallbladder. No bile duct dilation. Pancreas: Unremarkable. No pancreatic ductal dilatation or surrounding inflammatory changes. Spleen: Normal in size without focal abnormality. Adrenals/Urinary Tract: No adrenal masses. Moderate to severe right renal atrophy. Left kidney normal in size. Both kidneys normal in position. Subcentimeter hyperattenuating mass projects posteriorly from the right kidney pole, consistent with a mildly complicated cyst, stable from the prior CT. No other renal masses, no stones and no hydronephrosis. Normal ureters. Bladder is mildly distended. No bladder wall thickening. There is air that tracks along the lower anterior bladder wall, which may reside within the wall. Stomach/Bowel: Stomach is unremarkable. Small bowel is normal in caliber. No wall thickening or evidence of inflammation. Colon is normal caliber. There are numerous diverticula most evident along the sigmoid colon. No evidence of diverticulitis. No colonic wall thickening or other inflammatory process. Normal appendix  visualized. Vascular/Lymphatic: Mild aortic atherosclerosis. No aneurysm. No enlarged lymph nodes. Reproductive: Stable changes from a prior prostatectomy and penile implant insertion. Other: Small amount of ascites collects adjacent to the liver and spleen, tracks along the pericolic gutters and extends into the pelvic recesses. No free air. Musculoskeletal: No fracture or acute finding. Degenerative changes throughout the visualized spine and arthropathic changes of both hips. No osteoblastic or osteolytic lesions. IMPRESSION: 1. Small right pleural effusion, lung base vascular prominence and interstitial thickening and mild cardiomegaly. Findings support mild congestive heart failure if there are symptoms of shortness of breath. 2. Small amount of ascites unclear etiology. 3. Small amount of air tracks along the anterior inferior aspect of the bladder, appearing to project within the bladder wall. There is no bladder wall thickening. No bladder masses. The etiology of this finding is unclear. Correlate with any history of recent bladder instrumentation. Emphysematous cystitis should be considered in the proper clinical setting, despite the lack of bladder wall thickening. 4. No other evidence of an acute abnormality. 5. Numerous colonic diverticula mostly along the sigmoid colon. No evidence of diverticulitis. 6. Moderate to marked right renal atrophy. 7. Aortic atherosclerosis. Electronically Signed   By: Lajean Manes M.D.   On: 06/21/2018 14:26     LOS: 5 days   Oren Binet, MD  [facility]  If 7PM-7AM, please contact night-coverage  Please page via [email or web address]  Go to [email or web address] and use [facility] universal password to access. If you do not have the password, please contact the hospital operator.  Locate the [facility] provider you are looking for under [facility] and page to a number that you can be directly reached. If you still have difficulty reaching the provider, please page  the [facility] (Director on Call) for the Hospitalists listed on amion for assistance.  06/01/2018, 10:55 AM

## 2018-06-02 LAB — BASIC METABOLIC PANEL
Anion gap: 11 (ref 5–15)
BUN: 68 mg/dL — ABNORMAL HIGH (ref 8–23)
CO2: 15 mmol/L — ABNORMAL LOW (ref 22–32)
Calcium: 8.6 mg/dL — ABNORMAL LOW (ref 8.9–10.3)
Chloride: 115 mmol/L — ABNORMAL HIGH (ref 98–111)
Creatinine, Ser: 2.28 mg/dL — ABNORMAL HIGH (ref 0.61–1.24)
GFR calc Af Amer: 29 mL/min — ABNORMAL LOW (ref >60)
GFR calc non Af Amer: 25 mL/min — ABNORMAL LOW (ref >60)
Glucose, Bld: 96 mg/dL (ref 70–99)
Potassium: 4.5 mmol/L (ref 3.5–5.1)
Sodium: 141 mmol/L (ref 135–145)

## 2018-06-02 MED ORDER — FINASTERIDE 5 MG PO TABS
5.0000 mg | ORAL_TABLET | Freq: Every day | ORAL | Status: DC
  Administered 2018-06-02 – 2018-06-03 (×2): 5 mg via ORAL
  Filled 2018-06-02 (×2): qty 1

## 2018-06-02 MED ORDER — SODIUM BICARBONATE 650 MG PO TABS
650.0000 mg | ORAL_TABLET | Freq: Two times a day (BID) | ORAL | Status: DC
  Administered 2018-06-02 – 2018-06-03 (×4): 650 mg via ORAL
  Filled 2018-06-02 (×4): qty 1

## 2018-06-02 MED ORDER — TAMSULOSIN HCL 0.4 MG PO CAPS
0.4000 mg | ORAL_CAPSULE | Freq: Two times a day (BID) | ORAL | Status: DC
  Administered 2018-06-02 – 2018-06-03 (×4): 0.4 mg via ORAL
  Filled 2018-06-02 (×4): qty 1

## 2018-06-02 MED ORDER — METOPROLOL TARTRATE 25 MG PO TABS
25.0000 mg | ORAL_TABLET | Freq: Two times a day (BID) | ORAL | Status: DC
  Administered 2018-06-02 – 2018-06-03 (×3): 25 mg via ORAL
  Filled 2018-06-02 (×4): qty 1

## 2018-06-02 NOTE — Progress Notes (Signed)
Bladder scan 459. Placed warm compression on lower abd and will have patient stand in about 20 mins to see if he can void on his own.  Will contact MD if unsuccessful.

## 2018-06-02 NOTE — Progress Notes (Addendum)
PROGRESS NOTE        PATIENT DETAILS Name: James Harvey Age: 85 y.o. Sex: male Date of Birth: 05-29-1932 Admit Date: 06/07/2018 Admitting Physician Shelda Pal, DO TXM:IWOEHO, Jeneen Rinks, MD  Brief Narrative: Patient is a 85 y.o. male with history of atrial fibrillation on anticoagulation with Eliquis, CAD s/p PCI to mid LAD in June 2019, s/p permanent pacemaker implantation,?  Dementia/cognitive dysfunction at baseline presented to the ED with worsening confusion, intermittent rectal bleeding-found to have acute kidney injury and admitted to the hospitalist service.  See below for further details.  Subjective: Awake-somewhat confused-but not lethargic-slowly improving.  Assessment/Plan: Acute metabolic encephalopathy: Felt to be multifactorial etiology-secondary to worsening renal function, complicated UTI-with some suspicion that this is in the background of cognitive dysfunction/dementia.  CT head without any abnormalities.  Hospital course complicated by delirium-which was likely secondary to sundowning-but also worsened by the use of antipsychotics, sedatives.  All antipsychotic/sedatives were discontinued-bedside sitter was placed-he has now slowly improved with this measure-although slightly confused she is no longer lethargic.  CT head x2- for acute abnormalities-unable to perform MRI due to PPM.  EEG without seizures.  Vitamin B12 within normal limits, RPR negative.  Suspect has some amount of dementia at baseline-may not be evident as he lives at home and in familiar surroundings.  Spoke with neurology over the phone-recommends continue supportive care to see if we can continue to get renal function better. Did speak with patient's son over the past few days-apparently patient does get delirium whenever he is in the hospital-son does not know of any formal diagnosis of dementia-apparently the family started living with him only 2 weeks ago as he started  getting confused.  Per son, patient takes Xanax intermittently-he is not sure if patient takes it on a daily basis.  AKI: Multifactorial-likely secondary to UTI/dehydration and intermittent urinary retention.  Has required in and out catheterizations intermittently.  Per nursing staff-did not require catheterization last night.  Continue to treat UTI with antimicrobial therapy-continue to hold losartan-renal function slowly improving-supportive care.  Renal ultrasound without hydronephrosis.    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  Urine culture positive for Klebsiella pneumonia-continue IV cefazolin-last day of antibiotics on 4/7.    Hematochezia: No further episodes since admission-completed Anusol x3 days.  Hemoglobin stable.  Eliquis is been continued cautiously.  Plans are to follow-if hematochezia reoccurs-we will then consult GI formally.  Mild decompensated diastolic heart failure: Volume status is stable-follow for now.  Not on any diuretics at this time.   PAF: Rate controlled with metoprolol-continue Eliquis cautiously for now.  After speaking with patient's primary cardiologist over the phone-have discontinued Plavix.  ChadsVasc score of 4  Minimally elevated troponin: Trend is flat-not consistent with ACS.  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 Einar Gip) on 4/2-continue to hold Plavix given hematochezia.  Continue statin  ??Dementia: Suspect Patient has significant amount of dementia at baseline-suspect that may not be evident at home as he may thrive and family surroundings.  See above-we will need outpatient neurology evaluation.     Palliative  care: Elderly gentleman-who probably has some amount of dementia-may not be very evident as he lives at home-presented with AKI, kidney injury and worsening confusion for the past-palliative care following-full code for now.  DVT Prophylaxis: Full dose anticoagulation with Eliquis  Code Status: Full code   Family Communication: Son over the phone 4/8  Disposition Plan: Remain inpatient  Antimicrobial agents: Anti-infectives (From admission, onward)   Start     Dose/Rate Route Frequency Ordered Stop   06/01/18 2200  ceFAZolin (ANCEF) IVPB 1 g/50 mL premix     1 g 100 mL/hr over 30 Minutes Intravenous Every 12 hours 06/01/18 1057 06/02/18 0959   05/29/18 1100  ceFAZolin (ANCEF) IVPB 1 g/50 mL premix  Status:  Discontinued     1 g 100 mL/hr over 30 Minutes Intravenous Every 12 hours 05/29/18 1046 06/01/18 1057   05/27/18 1600  cefTRIAXone (ROCEPHIN) 1 g in sodium chloride 0.9 % 100 mL IVPB  Status:  Discontinued     1 g 200 mL/hr over 30 Minutes Intravenous Every 24 hours 06/08/2018 1630 05/29/18 1046   06/17/2018 1515  cefTRIAXone (ROCEPHIN) 1 g in sodium chloride 0.9 % 100 mL IVPB     1 g 200 mL/hr over 30 Minutes Intravenous  Once 06/10/2018 1511 06/18/2018 1700      Procedures: None  CONSULTS:  None  Time spent: 25- minutes-Greater than 50% of this time was spent in counseling, explanation of diagnosis, planning of further management, and coordination of care.  MEDICATIONS: Scheduled Meds:  apixaban  2.5 mg Oral BID   atorvastatin  40 mg Oral Daily   dorzolamide  1 drop Both Eyes BID   ferrous sulfate  325 mg Oral Q breakfast   latanoprost  1 drop Both Eyes QHS   metoprolol tartrate  25 mg Oral BID   pantoprazole  40 mg Oral Daily   sodium bicarbonate  650 mg Oral BID   tamsulosin  0.4 mg Oral BID   Continuous Infusions:   ceFAZolin (ANCEF) IV     lactated ringers 50 mL/hr at 06/01/18 1220   PRN Meds:.acetaminophen **OR** acetaminophen,  nitroGLYCERIN, ondansetron **OR** ondansetron (ZOFRAN) IV, polyethylene glycol   PHYSICAL EXAM: Vital signs: Vitals:   06/01/18 0903 06/01/18 1419 06/01/18 2228 06/02/18 0442  BP: 132/65 (!) 111/97 129/80 118/74  Pulse: 60 63 (!) 111 (!) 56  Resp:  20 20 20   Temp:  (!) 97.4 F (36.3 C) (!) 97.3 F (36.3 C) 98.5 F (36.9 C)  TempSrc:  Oral Oral Axillary  SpO2:  90% 100% 100%  Weight:      Height:       Filed Weights   06/13/2018 1848 06/01/18 0448  Weight: 99.7 kg 99.2 kg   Body mass index is 29.66 kg/m.   General appearance: Pleasantly confused-not in any distress-occasionally answers questions. Eyes:no scleral icterus. HEENT: Atraumatic and Normocephalic Neck: supple, no JVD. Resp:Good air entry bilaterally,no rales or rhonchi heard anteriorly CVS: S1 S2 regular, no murmurs.  GI: Bowel sounds present, Non tender and not distended with no gaurding, rigidity or rebound. Extremities: B/L Lower Ext shows no edema, both legs are warm to touch Neurology: Moving all 4 extremities Musculoskeletal:No digital cyanosis Skin:No Rash, warm and dry Wounds:N/A  I have personally reviewed following labs and imaging studies  LABORATORY DATA: CBC: Recent Labs  Lab 06/03/2018 1215 05/27/18 0037 05/28/18 0937 05/29/18 0255 05/31/18 0420  WBC 8.8 8.6 8.4 8.1 10.4  NEUTROABS 7.0  --   --   --   --  HGB 12.3* 11.1* 11.5* 11.3* 11.5*  HCT 40.3 35.5* 37.6* 36.0* 36.8*  MCV 96.2 90.6 92.8 91.8 91.5  PLT 159 160 168 140* 89*    Basic Metabolic Panel: Recent Labs  Lab 05/28/18 0937 05/29/18 0255 05/30/18 0548 05/31/18 0420 06/01/18 0721 06/02/18 0230  NA 144 146* 143 145 146* 141  K 4.5 4.8 4.3 4.2 4.0 4.5  CL 112* 115* 114* 114* 114* 115*  CO2 17* 17* 18* 19* 21* 15*  GLUCOSE 107* 112* 128* 117* 103* 96  BUN 57* 65* 80* 81* 72* 68*  CREATININE 2.19* 2.30* 3.03* 2.84* 2.35* 2.28*  CALCIUM 9.1 8.9 8.7* 8.8* 8.9 8.6*  MG 2.5*  --   --   --   --   --     GFR: Estimated  Creatinine Clearance: 29.4 mL/min (A) (by C-G formula based on SCr of 2.28 mg/dL (H)).  Liver Function Tests: Recent Labs  Lab 06/21/2018 1215 05/31/18 0420  AST 69* 63*  ALT 43 36  ALKPHOS 99 92  BILITOT 1.9* 1.3*  PROT 6.8 6.2*  ALBUMIN 3.3* 2.9*   No results for input(s): LIPASE, AMYLASE in the last 168 hours. Recent Labs  Lab 05/30/18 1303  AMMONIA 37*    Coagulation Profile: Recent Labs  Lab 06/24/2018 1215  INR 2.8*    Cardiac Enzymes: Recent Labs  Lab 06/05/2018 1215 05/30/2018 1825 05/27/18 0037  TROPONINI 0.06* 0.05* 0.05*    BNP (last 3 results) No results for input(s): PROBNP in the last 8760 hours.  HbA1C: No results for input(s): HGBA1C in the last 72 hours.  CBG: No results for input(s): GLUCAP in the last 168 hours.  Lipid Profile: No results for input(s): CHOL, HDL, LDLCALC, TRIG, CHOLHDL, LDLDIRECT in the last 72 hours.  Thyroid Function Tests: Recent Labs    05/30/18 1303  TSH 2.111    Anemia Panel: Recent Labs    05/30/18 1303  VITAMINB12 1,809*    Urine analysis:    Component Value Date/Time   COLORURINE YELLOW 06/13/2018 1432   APPEARANCEUR CLOUDY (A) 06/12/2018 1432   LABSPEC 1.015 06/05/2018 1432   PHURINE 6.0 06/09/2018 1432   GLUCOSEU NEGATIVE 05/27/2018 1432   HGBUR SMALL (A) 05/31/2018 1432   BILIRUBINUR NEGATIVE 06/01/2018 1432   KETONESUR 15 (A) 06/01/2018 1432   PROTEINUR 30 (A) 06/22/2018 1432   UROBILINOGEN 0.2 01/12/2012 1318   NITRITE NEGATIVE 06/06/2018 1432   LEUKOCYTESUR MODERATE (A) 05/27/2018 1432    Sepsis Labs: Lactic Acid, Venous    Component Value Date/Time   LATICACIDVEN 2.46 (HH) 01/05/2017 1953    MICROBIOLOGY: Recent Results (from the past 240 hour(s))  Urine culture     Status: Abnormal   Collection Time: 06/19/2018  2:32 PM  Result Value Ref Range Status   Specimen Description URINE, CLEAN CATCH  Final   Special Requests   Final    NONE Performed at [facility], [place]  [address], [place], [place] [address]    Culture >=100,000 COLONIES/mL KLEBSIELLA PNEUMONIAE (A)  Final   Report Status 05/28/2018 FINAL  Final   Organism ID, Bacteria KLEBSIELLA PNEUMONIAE (A)  Final      Susceptibility   Klebsiella pneumoniae - MIC*    AMPICILLIN >=32 RESISTANT Resistant     CEFAZOLIN <=4 SENSITIVE Sensitive     CEFTRIAXONE <=1 SENSITIVE Sensitive     CIPROFLOXACIN <=0.25 SENSITIVE Sensitive     GENTAMICIN <=1 SENSITIVE Sensitive     IMIPENEM 0.5 SENSITIVE Sensitive  NITROFURANTOIN 128 RESISTANT Resistant     TRIMETH/SULFA <=20 SENSITIVE Sensitive     AMPICILLIN/SULBACTAM 8 SENSITIVE Sensitive     PIP/TAZO <=4 SENSITIVE Sensitive     Extended ESBL NEGATIVE Sensitive     * >=100,000 COLONIES/mL KLEBSIELLA PNEUMONIAE    RADIOLOGY STUDIES/RESULTS: Dg Chest 2 View  Result Date: 06/16/2018 CLINICAL DATA:  Loss of consciousness, no chest symptoms EXAM: CHEST - 2 VIEW COMPARISON:  08/12/2017 FINDINGS: Bilateral diffuse interstitial thickening. Small bilateral pleural effusions. No pneumothorax. Stable cardiomegaly. Prior CABG. Dual lead cardiac pacemaker. No acute osseous abnormality. IMPRESSION: Findings consistent with mild CHF. Electronically Signed   By: Kathreen Devoid   On: 06/22/2018 14:39   Ct Head Wo Contrast  Result Date: 05/30/2018 CLINICAL DATA:  Altered level of consciousness, unexplained EXAM: CT HEAD WITHOUT CONTRAST TECHNIQUE: Contiguous axial images were obtained from the base of the skull through the vertex without intravenous contrast. COMPARISON:  06/22/2018 FINDINGS: Brain: No evidence of acute infarction, hemorrhage, hydrocephalus, extra-axial collection or mass lesion/mass effect. Cerebral volume loss that is mild for age and stable. Minor chronic small vessel ischemic change. Vascular: Atherosclerotic calcification Skull: No acute or aggressive finding. Sinuses/Orbits: Left cataract resection IMPRESSION: Aging brain without acute or interval finding.  Electronically Signed   By: Monte Fantasia M.D.   On: 05/30/2018 12:03   Ct Head Wo Contrast  Result Date: 06/09/2018 CLINICAL DATA:  Altered level of consciousness. EXAM: CT HEAD WITHOUT CONTRAST TECHNIQUE: Contiguous axial images were obtained from the base of the skull through the vertex without intravenous contrast. COMPARISON:  CT scan of August 14, 2017. FINDINGS: Brain: Mild diffuse cortical atrophy is noted. Mild chronic ischemic white matter disease is noted. No mass effect or midline shift is noted. Ventricular size is within normal limits. There is no evidence of mass lesion, hemorrhage or acute infarction. Vascular: No hyperdense vessel or unexpected calcification. Skull: Normal. Negative for fracture or focal lesion. Sinuses/Orbits: No acute finding. Other: None. IMPRESSION: Mild diffuse cortical atrophy. Mild chronic ischemic white matter disease. No acute intracranial abnormality seen. Electronically Signed   By: Marijo Conception, M.D.   On: 06/20/2018 14:18   US Renal  Result Date: 05/30/2018 CLINICAL DATA:  Acute kidney injury.  Chronic kidney disease. EXAM: RENAL / URINARY TRACT ULTRASOUND COMPLETE COMPARISON:  AP CT on 05/27/2018 FINDINGS: Right Kidney: Renal measurements: 7.7 x 4.2 x 4.9 cm = volume: 84.3 mL . Echogenicity within normal limits. Diffuse renal parenchymal atrophy. Tiny sub-cm subcapsular cyst noted. No mass or hydronephrosis visualized. Left Kidney: Renal measurements: 10.7 x 6.5 x 6.9 cm = volume: 250.5 mL. Echogenicity within normal limits. No mass or hydronephrosis visualized. Bladder: Appears normal for degree of bladder distention. Other: 2 penile prosthesis reservoirs are seen in the pelvis, as demonstrated on recent CT. Small amount of free fluid noted in left upper quadrant, similar to that seen on previous CT. IMPRESSION: No evidence of hydronephrosis. Diffuse right renal parenchymal atrophy. Small amount of left upper quadrant ascites, as seen on recent CT.  Electronically Signed   By: Earle Gell M.D.   On: 05/30/2018 12:11   Dg Chest Port 1v Same Day  Result Date: 05/28/2018 CLINICAL DATA:  Dyspnea EXAM: PORTABLE CHEST 1 VIEW COMPARISON:  05/30/2018 chest radiograph. FINDINGS: Stable configuration 2 lead left subclavian pacemaker and intact sternotomy wires. Stable cardiomediastinal silhouette with moderate cardiomegaly. No pneumothorax. Possible trace bilateral pleural effusions, unchanged. Stable mild pulmonary edema. IMPRESSION: 1. Persistent mild congestive heart failure, not significantly changed.  2. Possible trace bilateral pleural effusions. Electronically Signed   By: Ilona Sorrel M.D.   On: 05/28/2018 08:32   Ct Renal Stone Study  Result Date: 06/13/2018 CLINICAL DATA:  Left lower quadrant pain. EXAM: CT ABDOMEN AND PELVIS WITHOUT CONTRAST TECHNIQUE: Multidetector CT imaging of the abdomen and pelvis was performed following the standard protocol without IV contrast. COMPARISON:  CT, 01/05/2017 FINDINGS: Lower chest: Mild cardiomegaly. Small right pleural effusion. Vascular prominence and mild interstitial thickening at the lung bases. Mild dependent subsegmental atelectasis, right lower lobe. Hepatobiliary: Liver normal in size. No mass or focal lesion. Dependent gallstones and a mostly decompressed gallbladder. No bile duct dilation. Pancreas: Unremarkable. No pancreatic ductal dilatation or surrounding inflammatory changes. Spleen: Normal in size without focal abnormality. Adrenals/Urinary Tract: No adrenal masses. Moderate to severe right renal atrophy. Left kidney normal in size. Both kidneys normal in position. Subcentimeter hyperattenuating mass projects posteriorly from the right kidney pole, consistent with a mildly complicated cyst, stable from the prior CT. No other renal masses, no stones and no hydronephrosis. Normal ureters. Bladder is mildly distended. No bladder wall thickening. There is air that tracks along the lower anterior bladder  wall, which may reside within the wall. Stomach/Bowel: Stomach is unremarkable. Small bowel is normal in caliber. No wall thickening or evidence of inflammation. Colon is normal caliber. There are numerous diverticula most evident along the sigmoid colon. No evidence of diverticulitis. No colonic wall thickening or other inflammatory process. Normal appendix visualized. Vascular/Lymphatic: Mild aortic atherosclerosis. No aneurysm. No enlarged lymph nodes. Reproductive: Stable changes from a prior prostatectomy and penile implant insertion. Other: Small amount of ascites collects adjacent to the liver and spleen, tracks along the pericolic gutters and extends into the pelvic recesses. No free air. Musculoskeletal: No fracture or acute finding. Degenerative changes throughout the visualized spine and arthropathic changes of both hips. No osteoblastic or osteolytic lesions. IMPRESSION: 1. Small right pleural effusion, lung base vascular prominence and interstitial thickening and mild cardiomegaly. Findings support mild congestive heart failure if there are symptoms of shortness of breath. 2. Small amount of ascites unclear etiology. 3. Small amount of air tracks along the anterior inferior aspect of the bladder, appearing to project within the bladder wall. There is no bladder wall thickening. No bladder masses. The etiology of this finding is unclear. Correlate with any history of recent bladder instrumentation. Emphysematous cystitis should be considered in the proper clinical setting, despite the lack of bladder wall thickening. 4. No other evidence of an acute abnormality. 5. Numerous colonic diverticula mostly along the sigmoid colon. No evidence of diverticulitis. 6. Moderate to marked right renal atrophy. 7. Aortic atherosclerosis. Electronically Signed   By: Lajean Manes M.D.   On: 06/16/2018 14:26     LOS: 6 days   Oren Binet, MD  [facility]  If 7PM-7AM, please contact  night-coverage  Please page via [email or web address]  Go to [email or web address] and use 's universal password to access. If you do not have the password, please contact the hospital operator.  Locate the [facility] provider you are looking for under [facility] and page to a number that you can be directly reached. If you still have difficulty reaching the provider, please page the [facility] (Director on Call) for the Hospitalists listed on amion for assistance.  06/02/2018, 8:56 AM

## 2018-06-02 NOTE — Progress Notes (Signed)
PIV placed.  Pt. Confused and removes PIV.  This is the sixth PIV in seven days.  IV team not able to continue placing PIV to be pulled out by Patient.  If IV therapy still desired recommend utilizing some protection device to prevent pt. From pulling out lines

## 2018-06-02 NOTE — Progress Notes (Signed)
New order to I/O cath q 6hrs PRN.  Did I/O cath, removed 300 mL of urine.

## 2018-06-02 NOTE — Progress Notes (Signed)
Patient only voided 50 ml on his own.  Will notify on call MD.  Noticed medication Proscar 5 mg PO ordered for tonight and daily.

## 2018-06-03 LAB — BASIC METABOLIC PANEL
Anion gap: 12 (ref 5–15)
BUN: 66 mg/dL — ABNORMAL HIGH (ref 8–23)
CO2: 19 mmol/L — ABNORMAL LOW (ref 22–32)
Calcium: 8.9 mg/dL (ref 8.9–10.3)
Chloride: 115 mmol/L — ABNORMAL HIGH (ref 98–111)
Creatinine, Ser: 2.23 mg/dL — ABNORMAL HIGH (ref 0.61–1.24)
GFR calc Af Amer: 30 mL/min — ABNORMAL LOW (ref >60)
GFR calc non Af Amer: 26 mL/min — ABNORMAL LOW (ref >60)
Glucose, Bld: 130 mg/dL — ABNORMAL HIGH (ref 70–99)
Potassium: 4.2 mmol/L (ref 3.5–5.1)
Sodium: 146 mmol/L — ABNORMAL HIGH (ref 135–145)

## 2018-06-03 MED ORDER — SODIUM CHLORIDE 0.45 % IV SOLN
INTRAVENOUS | Status: DC
  Administered 2018-06-03 (×2): via INTRAVENOUS

## 2018-06-03 NOTE — Progress Notes (Signed)
Pt bladder scan showed >700. However when RN attempted to I&O cath Pt refused and was becoming more agitated. Pt did agree to be toileted and voided 200cc. Pt still would not let RN cath him. Will try again when Pt calms down.

## 2018-06-03 NOTE — Progress Notes (Signed)
Physical Therapy Treatment Patient Details Name: James Harvey MRN: [id number] DOB: May 04, 1932 Today's Date: 06/03/2018    History of Present Illness 85 y.o. male with history of atrial fibrillation on anticoagulation with Eliquis, CAD s/p PCI to mid LAD in June 2019, s/p permanent pacemaker implantation presented to the ED with worsening confusion, intermittent rectal bleeding-found to have acute kidney injury. Dx acute metabolic encephalopathy likely due to AKI, on top of underlying dementia, and UTI    PT Comments    Pt progressing towards goals and much more agreeable to therapy this session. Required mod A to stand and min A for ambulation using RW this session. Increased SOB noted, however, sats at 92% on RA. Feel pt will require 24/7 assist at home for safety with mobility. Current recommendations appropriate if pt has necessary assist at home. Will continue to follow acutely to maximize functional mobility independence and safety.     Follow Up Recommendations  Supervision/Assistance - 24 hour;Other (comment)(If 24 hour reliable assistance at home, then home with HHPT )     Equipment Recommendations  Rolling walker with 5" wheels    Recommendations for Other Services       Precautions / Restrictions Precautions Precautions: Fall Precaution Comments: sitter in room Restrictions Weight Bearing Restrictions: No    Mobility  Bed Mobility Overal bed mobility: Needs Assistance Bed Mobility: Supine to Sit     Supine to sit: Mod assist     General bed mobility comments: Mod A for trunk elevation and required multimodal cues for sequencing to come to sitting at EOB.   Transfers Overall transfer level: Needs assistance Equipment used: Rolling walker (2 wheeled) Transfers: Sit to/from Stand Sit to Stand: Mod assist         General transfer comment: Mod A for lift assist and steadying. Despite multimodal cues for pt to push up from bed surface, pt pulling up from RW. 2nd  person required to stabilize RW.   Ambulation/Gait Ambulation/Gait assistance: Min assist;+2 safety/equipment(chair follow) Gait Distance (Feet): 75 Feet Assistive device: Rolling walker (2 wheeled) Gait Pattern/deviations: Shuffle;Decreased step length - right;Decreased step length - left;Trunk flexed Gait velocity: Decreased    General Gait Details: Heavy dependence on UE support. Cues for proximity to device, however, pt not responding well to cues. Increased DOE noted, however, oxygen sats at 92% on RA.    Stairs             Wheelchair Mobility    Modified Rankin (Stroke Patients Only)       Balance Overall balance assessment: Needs assistance Sitting-balance support: No upper extremity supported;Feet supported Sitting balance-Leahy Scale: Fair     Standing balance support: Bilateral upper extremity supported;During functional activity Standing balance-Leahy Scale: Poor Standing balance comment: Reliant on BUE Support                             Cognition Arousal/Alertness: Awake/alert Behavior During Therapy: WFL for tasks assessed/performed Overall Cognitive Status: No family/caregiver present to determine baseline cognitive functioning                                 General Comments: Pt much more agreeable to work with PT today.       Exercises      General Comments        Pertinent Vitals/Pain Pain Assessment: No/denies pain    Home Living  Prior Function            PT Goals (current goals can now be found in the care plan section) Acute Rehab PT Goals Patient Stated Goal: none stated  PT Goal Formulation: With patient Time For Goal Achievement: 06/10/18 Potential to Achieve Goals: Fair Progress towards PT goals: Progressing toward goals    Frequency    Min 3X/week      PT Plan Current plan remains appropriate    Co-evaluation              AM-PAC PT "6 Clicks"  Mobility   Outcome Measure  Help needed turning from your back to your side while in a flat bed without using bedrails?: A Little Help needed moving from lying on your back to sitting on the side of a flat bed without using bedrails?: A Lot Help needed moving to and from a bed to a chair (including a wheelchair)?: A Little Help needed standing up from a chair using your arms (e.g., wheelchair or bedside chair)?: A Lot Help needed to walk in hospital room?: A Little Help needed climbing 3-5 steps with a railing? : A Lot 6 Click Score: 15    End of Session Equipment Utilized During Treatment: Gait belt Activity Tolerance: Patient tolerated treatment well Patient left: in chair;with call bell/phone within reach;with chair alarm set;with nursing/sitter in room Nurse Communication: Mobility status PT Visit Diagnosis: Unsteadiness on feet (R26.81);Other abnormalities of gait and mobility (R26.89);Muscle weakness (generalized) (M62.81);Difficulty in walking, not elsewhere classified (R26.2)     Time: 1350-1403 PT Time Calculation (min) (ACUTE ONLY): 13 min  Charges:  $Gait Training: 8-22 mins                     Leighton Ruff, PT, DPT  Acute Rehabilitation Services  Pager: [phone] Office: [phone]    Rudean Hitt 06/03/2018, 2:41 PM

## 2018-06-03 NOTE — Care Management Important Message (Signed)
Important Message  Patient Details  Name: James Harvey MRN: [id number] Date of Birth: [birth year]   Medicare Important Message Given:  Yes  Due to illness patient is not able to sign.  [name] 06/03/2018, 4:01 PM

## 2018-06-03 NOTE — Care Management Important Message (Signed)
Important Message  Patient Details  Name: James Harvey MRN: [id number] Date of Birth: [birth year]   Medicare Important Message Given:  Yes    Anacarolina Evelyn 06/03/2018, 4:00 PM

## 2018-06-03 NOTE — Progress Notes (Addendum)
PROGRESS NOTE        PATIENT DETAILS Name: James Harvey Age: 85 y.o. Sex: male Date of Birth: [birth year] Admit Date: 06/24/2018 Admitting Physician Shelda Pal, DO IOE:VOJJKK, Jeneen Rinks, MD  Brief Narrative: Patient is a 85 y.o. male with history of atrial fibrillation on anticoagulation with Eliquis, CAD s/p PCI to mid LAD in June 2019, s/p permanent pacemaker implantation,?  Dementia/cognitive dysfunction at baseline presented to the ED with worsening confusion, intermittent rectal bleeding-found to have acute kidney injury and admitted to the hospitalist service.  See below for further details.  Subjective: Much more awake-eating breakfast this morning-significantly less confused than yesterday.  Telling me about his business.  Did not receive Haldol last night.  Assessment/Plan: Acute metabolic encephalopathy: Felt to be multifactorial etiology-secondary to worsening renal function, complicated UTI-with some suspicion that this is in the background of cognitive dysfunction/dementia.  CT head without any abnormalities.  Hospital course complicated by delirium-which was likely secondary to sundowning-but also worsened by the use of antipsychotics, sedatives.  All antipsychotic/sedatives were discontinued-bedside sitter was placed-he has now slowly improved with this measure-although slightly confused she is no longer lethargic.  CT head x2- for acute abnormalities-unable to perform MRI due to PPM.  EEG without seizures.  Vitamin B12 within normal limits, RPR negative.  Suspect has some amount of dementia at baseline-may not be evident as he lives at home and in familiar surroundings.  Spoke with neurology over the phone-recommends continue supportive care to see if we can continue to get renal function better. Did speak with patient's son over the past few days-apparently patient does get delirium whenever he is in the hospital-son does not know of any formal  diagnosis of dementia-apparently the family started living with him only 2 weeks ago as he started getting confused.  Per son, patient takes Xanax intermittently-he is not sure if patient takes it on a daily basis.  AKI: Multifactorial-likely secondary to UTI/dehydration and intermittent urinary retention.  Has required in and out catheterizations intermittently.  Continue bladder scan/intermittent catheterization as much as possible-given his issues with dementia and agitation-trying to avoid placing a Foley catheter as much as possible (risk for traumatic Foley injury as he may pull out the catheter).  Continue losartan-renal ultrasound without any hydronephrosis.  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  Urine culture positive for Klebsiella pneumonia-continue IV cefazolin-last day of antibiotics on 4/7.    Hematochezia: No further episodes since admission-completed Anusol x3 days.  Hemoglobin stable.  Eliquis is been continued cautiously.  Plans are to follow-if hematochezia reoccurs-we will then consult GI formally.  Mild decompensated diastolic heart failure: Volume status is stable-follow for now.  Not on any diuretics at this time.    PAF: Rate controlled with metoprolol-continue Eliquis cautiously for now.  After speaking with patient's primary cardiologist over the phone-have discontinued Plavix.  ChadsVasc score of 4  Minimally elevated troponin: Trend is flat-not consistent with ACS.  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 Einar Gip) on 4/2-continue to hold Plavix given hematochezia.  Continue statin  ??Dementia: Suspect Patient has significant  amount of dementia at baseline-suspect that may not be evident at home as he may thrive and family surroundings.  See above-we will need outpatient neurology evaluation.    Palliative care: Elderly gentleman-who probably has some amount of dementia-may not be very evident as he lives at home-presented with AKI, kidney injury and worsening confusion for the past probably--palliative care following-full code for now.  DVT Prophylaxis: Full dose anticoagulation with Eliquis  Code Status: Full code   Family Communication: Son over the phone 4/8  Disposition Plan: Remain inpatient  Antimicrobial agents: Anti-infectives (From admission, onward)   Start     Dose/Rate Route Frequency Ordered Stop   06/01/18 2200  ceFAZolin (ANCEF) IVPB 1 g/50 mL premix     1 g 100 mL/hr over 30 Minutes Intravenous Every 12 hours 06/01/18 1057 06/02/18 0959   05/29/18 1100  ceFAZolin (ANCEF) IVPB 1 g/50 mL premix  Status:  Discontinued     1 g 100 mL/hr over 30 Minutes Intravenous Every 12 hours 05/29/18 1046 06/01/18 1057   05/27/18 1600  cefTRIAXone (ROCEPHIN) 1 g in sodium chloride 0.9 % 100 mL IVPB  Status:  Discontinued     1 g 200 mL/hr over 30 Minutes Intravenous Every 24 hours 05/29/2018 1630 05/29/18 1046   05/27/2018 1515  cefTRIAXone (ROCEPHIN) 1 g in sodium chloride 0.9 % 100 mL IVPB     1 g 200 mL/hr over 30 Minutes Intravenous  Once 05/28/2018 1511 06/17/2018 1700      Procedures: None  CONSULTS:  None  Time spent: 25- minutes-Greater than 50% of this time was spent in counseling, explanation of diagnosis, planning of further management, and coordination of care.  MEDICATIONS: Scheduled Meds:  apixaban  2.5 mg Oral BID   atorvastatin  40 mg Oral Daily   dorzolamide  1 drop Both Eyes BID   ferrous sulfate  325 mg Oral Q breakfast   finasteride  5 mg Oral Daily   latanoprost  1 drop Both Eyes QHS   metoprolol tartrate  25 mg Oral BID   pantoprazole  40 mg Oral Daily   sodium  bicarbonate  650 mg Oral BID   tamsulosin  0.4 mg Oral BID   Continuous Infusions:  sodium chloride 60 mL/hr at 06/03/18 0755   PRN Meds:.acetaminophen **OR** acetaminophen, nitroGLYCERIN, ondansetron **OR** ondansetron (ZOFRAN) IV, polyethylene glycol   PHYSICAL EXAM: Vital signs: Vitals:   06/02/18 2127 06/03/18 0624 06/03/18 0653 06/03/18 0703  BP: 132/75 138/62    Pulse: 66 (!) 49 (!) 103   Resp:  (!) 25  20  Temp: 97.7 F (36.5 C) (!) 97.5 F (36.4 C)    TempSrc: Oral Oral    SpO2: 95% (!) 89% 98%   Weight:      Height:       Filed Weights   05/28/2018 1848 06/01/18 0448  Weight: 99.7 kg 99.2 kg   Body mass index is 29.66 kg/m.   General appearance: Much more awake compared to yesterday-alert-eating breakfast.   Eyes:no scleral icterus. HEENT: Atraumatic and Normocephalic Neck: supple, no JVD. Resp:Good air entry bilaterally, scattered rhonchi CVS: S1 S2 regular, no murmurs.  GI: Bowel sounds present, Non tender and not distended with no gaurding, rigidity or rebound. Extremities: B/L Lower Ext shows no edema, both legs are warm to touch Neurology:  Non focal Musculoskeletal:No digital cyanosis Skin:No Rash, warm and dry Wounds:N/A  I have personally reviewed following labs and imaging studies  LABORATORY DATA: CBC: Recent Labs  Lab  05/28/18 0937 05/29/18 0255 05/31/18 0420  WBC 8.4 8.1 10.4  HGB 11.5* 11.3* 11.5*  HCT 37.6* 36.0* 36.8*  MCV 92.8 91.8 91.5  PLT 168 140* 89*    Basic Metabolic Panel: Recent Labs  Lab 05/28/18 0937  05/30/18 0548 05/31/18 0420 06/01/18 0721 06/02/18 0230 06/03/18 0241  NA 144   < > 143 145 146* 141 146*  K 4.5   < > 4.3 4.2 4.0 4.5 4.2  CL 112*   < > 114* 114* 114* 115* 115*  CO2 17*   < > 18* 19* 21* 15* 19*  GLUCOSE 107*   < > 128* 117* 103* 96 130*  BUN 57*   < > 80* 81* 72* 68* 66*  CREATININE 2.19*   < > 3.03* 2.84* 2.35* 2.28* 2.23*  CALCIUM 9.1   < > 8.7* 8.8* 8.9 8.6* 8.9  MG 2.5*  --   --   --    --   --   --    < > = values in this interval not displayed.    GFR: Estimated Creatinine Clearance: 30.1 mL/min (A) (by C-G formula based on SCr of 2.23 mg/dL (H)).  Liver Function Tests: Recent Labs  Lab 05/31/18 0420  AST 63*  ALT 36  ALKPHOS 92  BILITOT 1.3*  PROT 6.2*  ALBUMIN 2.9*   No results for input(s): LIPASE, AMYLASE in the last 168 hours. Recent Labs  Lab 05/30/18 1303  AMMONIA 37*    Coagulation Profile: No results for input(s): INR, PROTIME in the last 168 hours.  Cardiac Enzymes: No results for input(s): CKTOTAL, CKMB, CKMBINDEX, TROPONINI in the last 168 hours.  BNP (last 3 results) No results for input(s): PROBNP in the last 8760 hours.  HbA1C: No results for input(s): HGBA1C in the last 72 hours.  CBG: No results for input(s): GLUCAP in the last 168 hours.  Lipid Profile: No results for input(s): CHOL, HDL, LDLCALC, TRIG, CHOLHDL, LDLDIRECT in the last 72 hours.  Thyroid Function Tests: No results for input(s): TSH, T4TOTAL, FREET4, T3FREE, THYROIDAB in the last 72 hours.  Anemia Panel: No results for input(s): VITAMINB12, FOLATE, FERRITIN, TIBC, IRON, RETICCTPCT in the last 72 hours.  Urine analysis:    Component Value Date/Time   COLORURINE YELLOW 06/12/2018 1432   APPEARANCEUR CLOUDY (A) 06/02/2018 1432   LABSPEC 1.015 05/29/2018 1432   PHURINE 6.0 05/28/2018 1432   GLUCOSEU NEGATIVE 06/13/2018 1432   HGBUR SMALL (A) 05/29/2018 1432   BILIRUBINUR NEGATIVE 05/31/2018 1432   KETONESUR 15 (A) 06/03/2018 1432   PROTEINUR 30 (A) 06/18/2018 1432   UROBILINOGEN 0.2 01/12/2012 1318   NITRITE NEGATIVE 05/27/2018 1432   LEUKOCYTESUR MODERATE (A) 06/19/2018 1432    Sepsis Labs: Lactic Acid, Venous    Component Value Date/Time   LATICACIDVEN 2.46 (HH) 01/05/2017 1953    MICROBIOLOGY: Recent Results (from the past 240 hour(s))  Urine culture     Status: Abnormal   Collection Time: 06/22/2018  2:32 PM  Result Value Ref Range Status    Specimen Description URINE, CLEAN CATCH  Final   Special Requests   Final    NONE Performed at [facility], [place] [address], [place], [place] [address]    Culture >=100,000 COLONIES/mL KLEBSIELLA PNEUMONIAE (A)  Final   Report Status 05/28/2018 FINAL  Final   Organism ID, Bacteria KLEBSIELLA PNEUMONIAE (A)  Final      Susceptibility   Klebsiella pneumoniae - MIC*    AMPICILLIN >=32 RESISTANT  Resistant     CEFAZOLIN <=4 SENSITIVE Sensitive     CEFTRIAXONE <=1 SENSITIVE Sensitive     CIPROFLOXACIN <=0.25 SENSITIVE Sensitive     GENTAMICIN <=1 SENSITIVE Sensitive     IMIPENEM 0.5 SENSITIVE Sensitive     NITROFURANTOIN 128 RESISTANT Resistant     TRIMETH/SULFA <=20 SENSITIVE Sensitive     AMPICILLIN/SULBACTAM 8 SENSITIVE Sensitive     PIP/TAZO <=4 SENSITIVE Sensitive     Extended ESBL NEGATIVE Sensitive     * >=100,000 COLONIES/mL KLEBSIELLA PNEUMONIAE    RADIOLOGY STUDIES/RESULTS: Dg Chest 2 View  Result Date: 06/23/2018 CLINICAL DATA:  Loss of consciousness, no chest symptoms EXAM: CHEST - 2 VIEW COMPARISON:  08/12/2017 FINDINGS: Bilateral diffuse interstitial thickening. Small bilateral pleural effusions. No pneumothorax. Stable cardiomegaly. Prior CABG. Dual lead cardiac pacemaker. No acute osseous abnormality. IMPRESSION: Findings consistent with mild CHF. Electronically Signed   By: Kathreen Devoid   On: 06/15/2018 14:39   Ct Head Wo Contrast  Result Date: 05/30/2018 CLINICAL DATA:  Altered level of consciousness, unexplained EXAM: CT HEAD WITHOUT CONTRAST TECHNIQUE: Contiguous axial images were obtained from the base of the skull through the vertex without intravenous contrast. COMPARISON:  06/15/2018 FINDINGS: Brain: No evidence of acute infarction, hemorrhage, hydrocephalus, extra-axial collection or mass lesion/mass effect. Cerebral volume loss that is mild for age and stable. Minor chronic small vessel ischemic change. Vascular: Atherosclerotic calcification Skull: No acute or  aggressive finding. Sinuses/Orbits: Left cataract resection IMPRESSION: Aging brain without acute or interval finding. Electronically Signed   By: Monte Fantasia M.D.   On: 05/30/2018 12:03   Ct Head Wo Contrast  Result Date: 06/07/2018 CLINICAL DATA:  Altered level of consciousness. EXAM: CT HEAD WITHOUT CONTRAST TECHNIQUE: Contiguous axial images were obtained from the base of the skull through the vertex without intravenous contrast. COMPARISON:  CT scan of August 14, 2017. FINDINGS: Brain: Mild diffuse cortical atrophy is noted. Mild chronic ischemic white matter disease is noted. No mass effect or midline shift is noted. Ventricular size is within normal limits. There is no evidence of mass lesion, hemorrhage or acute infarction. Vascular: No hyperdense vessel or unexpected calcification. Skull: Normal. Negative for fracture or focal lesion. Sinuses/Orbits: No acute finding. Other: None. IMPRESSION: Mild diffuse cortical atrophy. Mild chronic ischemic white matter disease. No acute intracranial abnormality seen. Electronically Signed   By: Marijo Conception, M.D.   On: 06/18/2018 14:18   US Renal  Result Date: 05/30/2018 CLINICAL DATA:  Acute kidney injury.  Chronic kidney disease. EXAM: RENAL / URINARY TRACT ULTRASOUND COMPLETE COMPARISON:  AP CT on 06/09/2018 FINDINGS: Right Kidney: Renal measurements: 7.7 x 4.2 x 4.9 cm = volume: 84.3 mL . Echogenicity within normal limits. Diffuse renal parenchymal atrophy. Tiny sub-cm subcapsular cyst noted. No mass or hydronephrosis visualized. Left Kidney: Renal measurements: 10.7 x 6.5 x 6.9 cm = volume: 250.5 mL. Echogenicity within normal limits. No mass or hydronephrosis visualized. Bladder: Appears normal for degree of bladder distention. Other: 2 penile prosthesis reservoirs are seen in the pelvis, as demonstrated on recent CT. Small amount of free fluid noted in left upper quadrant, similar to that seen on previous CT. IMPRESSION: No evidence of hydronephrosis.  Diffuse right renal parenchymal atrophy. Small amount of left upper quadrant ascites, as seen on recent CT. Electronically Signed   By: Earle Gell M.D.   On: 05/30/2018 12:11   Dg Chest Port 1v Same Day  Result Date: 05/28/2018 CLINICAL DATA:  Dyspnea EXAM: PORTABLE CHEST 1 VIEW  COMPARISON:  06/13/2018 chest radiograph. FINDINGS: Stable configuration 2 lead left subclavian pacemaker and intact sternotomy wires. Stable cardiomediastinal silhouette with moderate cardiomegaly. No pneumothorax. Possible trace bilateral pleural effusions, unchanged. Stable mild pulmonary edema. IMPRESSION: 1. Persistent mild congestive heart failure, not significantly changed. 2. Possible trace bilateral pleural effusions. Electronically Signed   By: Ilona Sorrel M.D.   On: 05/28/2018 08:32   Ct Renal Stone Study  Result Date: 06/13/2018 CLINICAL DATA:  Left lower quadrant pain. EXAM: CT ABDOMEN AND PELVIS WITHOUT CONTRAST TECHNIQUE: Multidetector CT imaging of the abdomen and pelvis was performed following the standard protocol without IV contrast. COMPARISON:  CT, 01/05/2017 FINDINGS: Lower chest: Mild cardiomegaly. Small right pleural effusion. Vascular prominence and mild interstitial thickening at the lung bases. Mild dependent subsegmental atelectasis, right lower lobe. Hepatobiliary: Liver normal in size. No mass or focal lesion. Dependent gallstones and a mostly decompressed gallbladder. No bile duct dilation. Pancreas: Unremarkable. No pancreatic ductal dilatation or surrounding inflammatory changes. Spleen: Normal in size without focal abnormality. Adrenals/Urinary Tract: No adrenal masses. Moderate to severe right renal atrophy. Left kidney normal in size. Both kidneys normal in position. Subcentimeter hyperattenuating mass projects posteriorly from the right kidney pole, consistent with a mildly complicated cyst, stable from the prior CT. No other renal masses, no stones and no hydronephrosis. Normal ureters. Bladder  is mildly distended. No bladder wall thickening. There is air that tracks along the lower anterior bladder wall, which may reside within the wall. Stomach/Bowel: Stomach is unremarkable. Small bowel is normal in caliber. No wall thickening or evidence of inflammation. Colon is normal caliber. There are numerous diverticula most evident along the sigmoid colon. No evidence of diverticulitis. No colonic wall thickening or other inflammatory process. Normal appendix visualized. Vascular/Lymphatic: Mild aortic atherosclerosis. No aneurysm. No enlarged lymph nodes. Reproductive: Stable changes from a prior prostatectomy and penile implant insertion. Other: Small amount of ascites collects adjacent to the liver and spleen, tracks along the pericolic gutters and extends into the pelvic recesses. No free air. Musculoskeletal: No fracture or acute finding. Degenerative changes throughout the visualized spine and arthropathic changes of both hips. No osteoblastic or osteolytic lesions. IMPRESSION: 1. Small right pleural effusion, lung base vascular prominence and interstitial thickening and mild cardiomegaly. Findings support mild congestive heart failure if there are symptoms of shortness of breath. 2. Small amount of ascites unclear etiology. 3. Small amount of air tracks along the anterior inferior aspect of the bladder, appearing to project within the bladder wall. There is no bladder wall thickening. No bladder masses. The etiology of this finding is unclear. Correlate with any history of recent bladder instrumentation. Emphysematous cystitis should be considered in the proper clinical setting, despite the lack of bladder wall thickening. 4. No other evidence of an acute abnormality. 5. Numerous colonic diverticula mostly along the sigmoid colon. No evidence of diverticulitis. 6. Moderate to marked right renal atrophy. 7. Aortic atherosclerosis. Electronically Signed   By: Lajean Manes M.D.   On: 06/14/2018 14:26      LOS: 7 days   Oren Binet, MD  [facility]  If 7PM-7AM, please contact night-coverage  Please page via [email or web address]  Go to [email or web address] and use [facility]'s universal password to access. If you do not have the password, please contact the hospital operator.  Locate the [facility] provider you are looking for under [facility] and page to a number that you can be directly reached. If you still have difficulty reaching the provider, please page the [facility] (  Director on Call) for the Hospitalists listed on amion for assistance.  06/03/2018, 10:01 AM

## 2018-06-03 NOTE — Progress Notes (Signed)
Bladder scan showed 192 mL.  Will have day staff to follow up.

## 2018-06-04 ENCOUNTER — Inpatient Hospital Stay (HOSPITAL_COMMUNITY): Payer: Medicare Other | Admitting: Certified Registered"

## 2018-06-04 DIAGNOSIS — N3001 Acute cystitis with hematuria: Secondary | ICD-10-CM

## 2018-06-04 LAB — BASIC METABOLIC PANEL
Anion gap: 11 (ref 5–15)
BUN: 66 mg/dL — ABNORMAL HIGH (ref 8–23)
CO2: 20 mmol/L — ABNORMAL LOW (ref 22–32)
Calcium: 9 mg/dL (ref 8.9–10.3)
Chloride: 112 mmol/L — ABNORMAL HIGH (ref 98–111)
Creatinine, Ser: 2.27 mg/dL — ABNORMAL HIGH (ref 0.61–1.24)
GFR calc Af Amer: 30 mL/min — ABNORMAL LOW (ref >60)
GFR calc non Af Amer: 26 mL/min — ABNORMAL LOW (ref >60)
Glucose, Bld: 116 mg/dL — ABNORMAL HIGH (ref 70–99)
Potassium: 4.5 mmol/L (ref 3.5–5.1)
Sodium: 143 mmol/L (ref 135–145)

## 2018-06-04 LAB — CBC
HCT: 36.9 % — ABNORMAL LOW (ref 39.0–52.0)
Hemoglobin: 11 g/dL — ABNORMAL LOW (ref 13.0–17.0)
MCH: 27.4 pg (ref 26.0–34.0)
MCHC: 29.8 g/dL — ABNORMAL LOW (ref 30.0–36.0)
MCV: 91.8 fL (ref 80.0–100.0)
Platelets: 101 10*3/uL — ABNORMAL LOW (ref 150–400)
RBC: 4.02 MIL/uL — ABNORMAL LOW (ref 4.22–5.81)
RDW: 17.3 % — ABNORMAL HIGH (ref 11.5–15.5)
WBC: 7.3 10*3/uL (ref 4.0–10.5)
nRBC: 0.4 % — ABNORMAL HIGH (ref 0.0–0.2)

## 2018-06-25 ENCOUNTER — Ambulatory Visit: Payer: Self-pay

## 2018-06-25 NOTE — Progress Notes (Signed)
  Patient Name: James Harvey   MRN: [id number]   Date of Birth/ Sex: [birth year] , male      Admission Date: 06/12/2018  Attending Provider: Jonetta Osgood, MD  Primary Diagnosis: Confusion [R41.0] Acute cystitis with hematuria [N30.01] AKI (acute kidney injury) ([place]) [N17.9]   Indication: Pt was in his usual state of health until this AM, when he was noted to be unresponsive. Code blue was subsequently called. At the time of arrival on scene, ACLS protocol was underway.   Technical Description:  - CPR performance duration:  15 minutes  - Was defibrillation or cardioversion used? No   - Was external pacer placed? No  - Was patient intubated pre/post CPR? Yes   Medications Administered: Y = Yes; Blank = No Amiodarone    Atropine    Calcium    Epinephrine  Y  Lidocaine    Magnesium    Norepinephrine    Phenylephrine    Sodium bicarbonate    Vasopressin    Other    Post CPR evaluation:  - Final Status - Was patient successfully resuscitated ? No   Miscellaneous Information:  - Time of death:  9:51 AM  - Primary team notified?  Yes  - Family Notified? Yes   Primary MD was on the floor and spoke with the family.    Neva Seat, MD   June 23, 2018, 10:01 AM

## 2018-06-25 NOTE — Progress Notes (Signed)
I had seen the patient earlier this morning-he was asleep-and was just waking up-he was confused but able to talk.  Vital signs were stable.  Plans were to reassess in an hour or so to see if his mental status had improved further-as we were considering discharge home in the next day or so.  Upon reevaluation-this MD found the patient unresponsive-bedside sitter said that he was moaning just a few minutes before I walked in and then became unresponsive.  RN walked in the same time as well. He was unarousable-no pulse obtained-CODE BLUE called-and CPR started per ACLS protocol.  I reached out to patient's son James Harvey (whom I was talking to on a daily basis for the past few days) and informed him of this sudden development-after running CPR for approximately 15 minutes-patient still was not able to be resuscitated.  After discussion with patient's son James Harvey-CPR was called off.  I subsequently got a call from the patient's daughter-in-law-I informed her about the chain of events-and the unsuccessful attempts at resuscitation.  I offered my condolences to the family.

## 2018-06-25 NOTE — Anesthesia Procedure Notes (Signed)
Procedure Name: Intubation Date/Time: June 07, 2018 9:47 AM Performed by: Lance Coon, CRNA Pre-anesthesia Checklist: Patient identified, Suction available and Emergency Drugs available Oxygen Delivery Method: Ambu bag Preoxygenation: Pre-oxygenation with 100% oxygen Laryngoscope Size: Glidescope and 4 Grade View: Grade I Tube type: Oral Tube size: 7.5 mm Number of attempts: 1 Airway Equipment and Method: Stylet and Video-laryngoscopy Placement Confirmation: ETT inserted through vocal cords under direct vision,  positive ETCO2 and breath sounds checked- equal and bilateral Secured at: 23 cm Tube secured with: Tape Dental Injury: Teeth and Oropharynx as per pre-operative assessment

## 2018-06-25 NOTE — Death Summary Note (Signed)
DEATH SUMMARY   Patient Details  Name: James Harvey MRN: [id number] DOB: 1932/06/18  Admission/Discharge Information   Admit Date:  2018/06/23  Date of Death: Date of Death: 07-02-2018  Time of Death: Time of Death: 0951  Length of Stay: 8  Referring Physician: Tamsen Roers, MD   Reason(s) for Hospitalization  Confusion, hematochezia  Diagnoses  Preliminary cause of death:  Secondary Diagnoses (including complications and co-morbidities):  Principal Problem:   Acute cystitis Active Problems:   CAD (coronary artery disease)   Atrioventricular block, complete (HCC)   Hypertension   Paroxysmal atrial fibrillation (HCC)   AKI (acute kidney injury) ([place])   Dementia ([place])   Encephalopathy   External hemorrhoid   Elevated troponin   Goals of care, counseling/discussion   Palliative care by specialist   Brief Hospital Course (including significant findings, care, treatment, and services provided and events leading to death)  Brief Narrative: Patient is a 85 y.o. male with history of atrial fibrillation on anticoagulation with Eliquis, CAD s/p PCI to mid LAD in June 2019, s/p permanent pacemaker implantation,?  Dementia/cognitive dysfunction at baseline presented to the ED with worsening confusion, intermittent rectal bleeding-found to have acute kidney injury and admitted to the hospitalist service.    Patient acute metabolic encephalopathy was felt to be multifactorial in the setting of AKI/UTI-patient was started on IV fluids, IV antibiotics.  Hospital course was complicated by waxing and waning confusion/encephalopathy, intermittent urinary retention.All sedatives/antipsychotics were discontinued because of waxing and waning confusion-bedside sitter was placed.  Gradually patient's mental status continued to improve-although he still had episodes of some confusion.  Renal failure slowly improved-after gentle hydration, and intermittent catheterization.  Plans were to discharge home  in the next few days-however on 4/10-patient was found unresponsive-and without a pulse.  CPR was initiated-unfortunately resuscitation efforts were unsuccessful.   Hospital course by problem list. Acute metabolic encephalopathy: Felt to be multifactorial etiology-secondary to worsening renal function, complicated UTI-with some suspicion that this is in the background of cognitive dysfunction/dementia.  CT head without any abnormalities.  Hospital course complicated by delirium-which was likely secondary to sundowning-but also worsened by the use of antipsychotics, sedatives.  All antipsychotic/sedatives were discontinued-bedside sitter was placed-he slowly improved with these measures.  CT head x2- for acute abnormalities-unable to perform MRI due to PPM.  EEG without seizures.  Vitamin B12 within normal limits, RPR negative.  Suspect has some amount of dementia at baseline-may not be evident as he lives at home and in familiar surroundings. Spoke with neurology (Dr Aroor) over the phone-recommends continue supportive care to see if we can continue to get renal function (BUN elevated) better. Spoke with patient's son on a daily basis,,apparently patient does get delirium whenever he is in the hospital-son does not know of any formal diagnosis of dementia-apparently the family started living with him only 2 weeks ago as he started getting confused.  Per son, patient takes Xanax intermittently-he is not sure if patient takes it on a daily basis.  AKI: Multifactorial-likely secondary to UTI/dehydration and intermittent urinary retention.  Has required in and out catheterizations intermittently.  Continue bladder scan/intermittent catheterization as much as possible-given his issues with dementia and agitation-trying to avoid placing a Foley catheter as much as possible (risk for traumatic Foley injury as he may pull out the catheter).  Losartan was held-patient was started on Flomax and dosing was increased to  twice daily-and started on finasteride as well.  Plans were to avoid Foley catheter as much as we  can but if he continued to have issues with urinary retention in spite of maximal medical therapy-plans were to place a Foley catheter as we had a bedside sitter in place.  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  Urine culture positive for Klebsiella pneumonia-managed with IV antibiotics-completed a course of antibiotics on 4/7.  Was afebrile and without leukocytosis.  Hematochezia: No further episodes since admission-completed Anusol x3 days.  Hemoglobin stable.  Eliquis was continued cautiously.  Plans were to follow-if hematochezia reoccurs- then consult GI formally.  Mild decompensated diastolic heart failure: Volume status is stable-except for mild lower extremity edema-not on any diuretics due to AKI.     PAF: Rate controlled with metoprolol-Eliquis was cautiously continue due to history of hematochezia.  After speaking with patient's primary cardiologist over the phone-discontinued Plavix.  ChadsVasc score of 4   Minimally elevated troponin: Trend is flat-not consistent with ACS.  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 Einar Gip) on 4/2-continue to hold Plavix given hematochezia on presentation.  Continue statin  Mild thrombocytopenia: Not sure this was secondary to UTI/sepsis-improved with supportive care-lowest platelet count was 89 on 4/6.  Given concern for hematochezia-Plavix was discontinued (see above)-patient was just maintained on Eliquis.  ??Dementia: Suspect Patient has significant amount of dementia at baseline-suspect that may not be  evident at home as he may thrive and family surroundings.  See above-we will need outpatient neurology evaluation.    Palliative care: Elderly gentleman-who probably has some amount of dementia (suspected)-may not be very evident as he lives at home an family surroundings-presented with AKI, kidney injury and worsening confusion.  Hospital course was prolonged by waxing and waning encephalopathy, worsening AKI.  Given multiple medical comorbidities-slow improvement-palliative care was consulted during this hospital stay as well.  He remained a full code.  Unfortunately on 4/10-patient was found unresponsive and without a pulse-CPR was initiated-we were unsuccessful in her attempts to resuscitate this patient.  Family was called-above situation was explained.  Pertinent Labs and Studies  Significant Diagnostic Studies Dg Chest 2 View  Result Date: 06/22/2018 CLINICAL DATA:  Loss of consciousness, no chest symptoms EXAM: CHEST - 2 VIEW COMPARISON:  08/12/2017 FINDINGS: Bilateral diffuse interstitial thickening. Small bilateral pleural effusions. No pneumothorax. Stable cardiomegaly. Prior CABG. Dual lead cardiac pacemaker. No acute osseous abnormality. IMPRESSION: Findings consistent with mild CHF. Electronically Signed   By: Kathreen Devoid   On: 06/08/2018 14:39   Ct Head Wo Contrast  Result Date: 05/30/2018 CLINICAL DATA:  Altered level of consciousness, unexplained EXAM: CT HEAD WITHOUT CONTRAST TECHNIQUE: Contiguous axial images were obtained from the base of the skull through the vertex without intravenous contrast. COMPARISON:  05/27/2018 FINDINGS: Brain: No evidence of acute infarction, hemorrhage, hydrocephalus, extra-axial collection or mass lesion/mass effect. Cerebral volume loss that is mild for age and stable. Minor chronic small vessel ischemic change. Vascular: Atherosclerotic calcification Skull: No acute or aggressive finding. Sinuses/Orbits: Left cataract resection IMPRESSION: Aging brain  without acute or interval finding. Electronically Signed   By: Monte Fantasia M.D.   On: 05/30/2018 12:03   Ct Head Wo Contrast  Result Date: 06/22/2018 CLINICAL DATA:  Altered level of consciousness. EXAM: CT HEAD WITHOUT CONTRAST TECHNIQUE: Contiguous axial images  were obtained from the base of the skull through the vertex without intravenous contrast. COMPARISON:  CT scan of August 14, 2017. FINDINGS: Brain: Mild diffuse cortical atrophy is noted. Mild chronic ischemic white matter disease is noted. No mass effect or midline shift is noted. Ventricular size is within normal limits. There is no evidence of mass lesion, hemorrhage or acute infarction. Vascular: No hyperdense vessel or unexpected calcification. Skull: Normal. Negative for fracture or focal lesion. Sinuses/Orbits: No acute finding. Other: None. IMPRESSION: Mild diffuse cortical atrophy. Mild chronic ischemic white matter disease. No acute intracranial abnormality seen. Electronically Signed   By: Marijo Conception, M.D.   On: 06/03/2018 14:18   US Renal  Result Date: 05/30/2018 CLINICAL DATA:  Acute kidney injury.  Chronic kidney disease. EXAM: RENAL / URINARY TRACT ULTRASOUND COMPLETE COMPARISON:  AP CT on 06/22/2018 FINDINGS: Right Kidney: Renal measurements: 7.7 x 4.2 x 4.9 cm = volume: 84.3 mL . Echogenicity within normal limits. Diffuse renal parenchymal atrophy. Tiny sub-cm subcapsular cyst noted. No mass or hydronephrosis visualized. Left Kidney: Renal measurements: 10.7 x 6.5 x 6.9 cm = volume: 250.5 mL. Echogenicity within normal limits. No mass or hydronephrosis visualized. Bladder: Appears normal for degree of bladder distention. Other: 2 penile prosthesis reservoirs are seen in the pelvis, as demonstrated on recent CT. Small amount of free fluid noted in left upper quadrant, similar to that seen on previous CT. IMPRESSION: No evidence of hydronephrosis. Diffuse right renal parenchymal atrophy. Small amount of left upper quadrant  ascites, as seen on recent CT. Electronically Signed   By: Earle Gell M.D.   On: 05/30/2018 12:11   Dg Chest Port 1v Same Day  Result Date: 05/28/2018 CLINICAL DATA:  Dyspnea EXAM: PORTABLE CHEST 1 VIEW COMPARISON:  06/22/2018 chest radiograph. FINDINGS: Stable configuration 2 lead left subclavian pacemaker and intact sternotomy wires. Stable cardiomediastinal silhouette with moderate cardiomegaly. No pneumothorax. Possible trace bilateral pleural effusions, unchanged. Stable mild pulmonary edema. IMPRESSION: 1. Persistent mild congestive heart failure, not significantly changed. 2. Possible trace bilateral pleural effusions. Electronically Signed   By: Ilona Sorrel M.D.   On: 05/28/2018 08:32   Ct Renal Stone Study  Result Date: 06/07/2018 CLINICAL DATA:  Left lower quadrant pain. EXAM: CT ABDOMEN AND PELVIS WITHOUT CONTRAST TECHNIQUE: Multidetector CT imaging of the abdomen and pelvis was performed following the standard protocol without IV contrast. COMPARISON:  CT, 01/05/2017 FINDINGS: Lower chest: Mild cardiomegaly. Small right pleural effusion. Vascular prominence and mild interstitial thickening at the lung bases. Mild dependent subsegmental atelectasis, right lower lobe. Hepatobiliary: Liver normal in size. No mass or focal lesion. Dependent gallstones and a mostly decompressed gallbladder. No bile duct dilation. Pancreas: Unremarkable. No pancreatic ductal dilatation or surrounding inflammatory changes. Spleen: Normal in size without focal abnormality. Adrenals/Urinary Tract: No adrenal masses. Moderate to severe right renal atrophy. Left kidney normal in size. Both kidneys normal in position. Subcentimeter hyperattenuating mass projects posteriorly from the right kidney pole, consistent with a mildly complicated cyst, stable from the prior CT. No other renal masses, no stones and no hydronephrosis. Normal ureters. Bladder is mildly distended. No bladder wall thickening. There is air that tracks  along the lower anterior bladder wall, which may reside within the wall. Stomach/Bowel: Stomach is unremarkable. Small bowel is normal in caliber. No wall thickening or evidence of inflammation. Colon is normal caliber. There are numerous diverticula most evident along the sigmoid colon. No evidence of diverticulitis. No colonic wall thickening or other inflammatory process. Normal appendix  visualized. Vascular/Lymphatic: Mild aortic atherosclerosis. No aneurysm. No enlarged lymph nodes. Reproductive: Stable changes from a prior prostatectomy and penile implant insertion. Other: Small amount of ascites collects adjacent to the liver and spleen, tracks along the pericolic gutters and extends into the pelvic recesses. No free air. Musculoskeletal: No fracture or acute finding. Degenerative changes throughout the visualized spine and arthropathic changes of both hips. No osteoblastic or osteolytic lesions. IMPRESSION: 1. Small right pleural effusion, lung base vascular prominence and interstitial thickening and mild cardiomegaly. Findings support mild congestive heart failure if there are symptoms of shortness of breath. 2. Small amount of ascites unclear etiology. 3. Small amount of air tracks along the anterior inferior aspect of the bladder, appearing to project within the bladder wall. There is no bladder wall thickening. No bladder masses. The etiology of this finding is unclear. Correlate with any history of recent bladder instrumentation. Emphysematous cystitis should be considered in the proper clinical setting, despite the lack of bladder wall thickening. 4. No other evidence of an acute abnormality. 5. Numerous colonic diverticula mostly along the sigmoid colon. No evidence of diverticulitis. 6. Moderate to marked right renal atrophy. 7. Aortic atherosclerosis. Electronically Signed   By: Lajean Manes M.D.   On: 05/31/2018 14:26    Microbiology Recent Results (from the past 240 hour(s))  Urine culture      Status: Abnormal   Collection Time: 05/30/2018  2:32 PM  Result Value Ref Range Status   Specimen Description URINE, CLEAN CATCH  Final   Special Requests   Final    NONE Performed at [facility], [place] [address], [place], [place] [address]    Culture >=100,000 COLONIES/mL KLEBSIELLA PNEUMONIAE (A)  Final   Report Status 05/28/2018 FINAL  Final   Organism ID, Bacteria KLEBSIELLA PNEUMONIAE (A)  Final      Susceptibility   Klebsiella pneumoniae - MIC*    AMPICILLIN >=32 RESISTANT Resistant     CEFAZOLIN <=4 SENSITIVE Sensitive     CEFTRIAXONE <=1 SENSITIVE Sensitive     CIPROFLOXACIN <=0.25 SENSITIVE Sensitive     GENTAMICIN <=1 SENSITIVE Sensitive     IMIPENEM 0.5 SENSITIVE Sensitive     NITROFURANTOIN 128 RESISTANT Resistant     TRIMETH/SULFA <=20 SENSITIVE Sensitive     AMPICILLIN/SULBACTAM 8 SENSITIVE Sensitive     PIP/TAZO <=4 SENSITIVE Sensitive     Extended ESBL NEGATIVE Sensitive     * >=100,000 COLONIES/mL KLEBSIELLA PNEUMONIAE    Lab Basic Metabolic Panel: Recent Labs  Lab 05/31/18 0420 06/01/18 0721 06/02/18 0230 06/03/18 0241 2018/06/16 0245  NA 145 146* 141 146* 143  K 4.2 4.0 4.5 4.2 4.5  CL 114* 114* 115* 115* 112*  CO2 19* 21* 15* 19* 20*  GLUCOSE 117* 103* 96 130* 116*  BUN 81* 72* 68* 66* 66*  CREATININE 2.84* 2.35* 2.28* 2.23* 2.27*  CALCIUM 8.8* 8.9 8.6* 8.9 9.0   Liver Function Tests: Recent Labs  Lab 05/31/18 0420  AST 63*  ALT 36  ALKPHOS 92  BILITOT 1.3*  PROT 6.2*  ALBUMIN 2.9*   No results for input(s): LIPASE, AMYLASE in the last 168 hours. Recent Labs  Lab 05/30/18 1303  AMMONIA 37*   CBC: Recent Labs  Lab 05/29/18 0255 05/31/18 0420 06-16-2018 0245  WBC 8.1 10.4 7.3  HGB 11.3* 11.5* 11.0*  HCT 36.0* 36.8* 36.9*  MCV 91.8 91.5 91.8  PLT 140* 89* 101*   Cardiac Enzymes: No results for input(s): CKTOTAL, CKMB, CKMBINDEX, TROPONINI in the  last 168 hours. Sepsis Labs: Recent Labs  Lab 05/29/18 0255 05/31/18 0420  Jun 07, 2018 0245  WBC 8.1 10.4 7.3    Procedures/Operations   Andrey Hoobler 06/07/2018, 10:27 AM

## 2018-06-25 NOTE — Progress Notes (Signed)
I responded to a page from the nurse to provide spiritual support for the patient's family. I visited the patient's room with his son and daughter-in-law present. Patient died and I provided words of comfort and led in prayer. I shared that the Chaplain is available to provide additional support as needed or requested.    Jun 27, 2018 1200  Clinical Encounter Type  Visited With Patient and family together  Visit Type Spiritual support;Death  Referral From Nurse  Consult/Referral To Chaplain  Spiritual Encounters  Spiritual Needs Prayer;Grief support    Chaplain Dr Redgie Grayer

## 2018-06-25 DEATH — deceased

## 2018-11-05 ENCOUNTER — Ambulatory Visit: Payer: BLUE CROSS/BLUE SHIELD | Admitting: Cardiology
